# Patient Record
Sex: Female | Born: 1950
Health system: Southern US, Community
[De-identification: ages and names within clinical notes are randomized; demographics above are authoritative.]

## PROBLEM LIST (undated history)

## (undated) DIAGNOSIS — R7989 Other specified abnormal findings of blood chemistry: Secondary | ICD-10-CM

## (undated) DIAGNOSIS — E785 Hyperlipidemia, unspecified: Secondary | ICD-10-CM

## (undated) DIAGNOSIS — I10 Essential (primary) hypertension: Secondary | ICD-10-CM

## (undated) DIAGNOSIS — K219 Gastro-esophageal reflux disease without esophagitis: Secondary | ICD-10-CM

## (undated) HISTORY — DX: Essential (primary) hypertension: I10

## (undated) HISTORY — PX: NASAL SEPTUM SURGERY: SHX37

## (undated) HISTORY — DX: Gastro-esophageal reflux disease without esophagitis: K21.9

## (undated) HISTORY — DX: Hyperlipidemia, unspecified: E78.5

## (undated) HISTORY — DX: Other specified abnormal findings of blood chemistry: R79.89

## (undated) HISTORY — PX: ABDOMINAL HYSTERECTOMY: SHX81

---

## 2003-12-22 ENCOUNTER — Encounter: Payer: Self-pay | Admitting: Family Medicine

## 2003-12-22 ENCOUNTER — Other Ambulatory Visit: Admission: RE | Admit: 2003-12-22 | Discharge: 2003-12-22 | Payer: Self-pay | Admitting: Family Medicine

## 2003-12-22 LAB — CONVERTED CEMR LAB: Pap Smear: NORMAL

## 2004-07-25 ENCOUNTER — Ambulatory Visit: Payer: Self-pay | Admitting: Family Medicine

## 2005-04-11 ENCOUNTER — Ambulatory Visit: Payer: Self-pay | Admitting: Family Medicine

## 2005-05-03 ENCOUNTER — Ambulatory Visit: Payer: Self-pay | Admitting: Internal Medicine

## 2005-05-30 ENCOUNTER — Ambulatory Visit: Payer: Self-pay | Admitting: Gastroenterology

## 2005-07-07 ENCOUNTER — Ambulatory Visit: Payer: Self-pay | Admitting: Gastroenterology

## 2005-07-07 ENCOUNTER — Ambulatory Visit (HOSPITAL_COMMUNITY): Admission: RE | Admit: 2005-07-07 | Discharge: 2005-07-07 | Payer: Self-pay | Admitting: Gastroenterology

## 2006-11-14 ENCOUNTER — Ambulatory Visit: Payer: Self-pay | Admitting: Family Medicine

## 2006-11-14 LAB — CONVERTED CEMR LAB
Nitrite: NEGATIVE
Specific Gravity, Urine: 1.01

## 2006-12-19 ENCOUNTER — Telehealth (INDEPENDENT_AMBULATORY_CARE_PROVIDER_SITE_OTHER): Payer: Self-pay | Admitting: *Deleted

## 2007-04-01 ENCOUNTER — Telehealth (INDEPENDENT_AMBULATORY_CARE_PROVIDER_SITE_OTHER): Payer: Self-pay | Admitting: *Deleted

## 2007-04-03 ENCOUNTER — Telehealth (INDEPENDENT_AMBULATORY_CARE_PROVIDER_SITE_OTHER): Payer: Self-pay | Admitting: *Deleted

## 2007-12-25 ENCOUNTER — Encounter: Payer: Self-pay | Admitting: Family Medicine

## 2007-12-25 DIAGNOSIS — E785 Hyperlipidemia, unspecified: Secondary | ICD-10-CM | POA: Insufficient documentation

## 2007-12-25 DIAGNOSIS — K449 Diaphragmatic hernia without obstruction or gangrene: Secondary | ICD-10-CM | POA: Insufficient documentation

## 2007-12-25 DIAGNOSIS — I10 Essential (primary) hypertension: Secondary | ICD-10-CM | POA: Insufficient documentation

## 2007-12-25 DIAGNOSIS — K219 Gastro-esophageal reflux disease without esophagitis: Secondary | ICD-10-CM | POA: Insufficient documentation

## 2007-12-26 ENCOUNTER — Ambulatory Visit: Payer: Self-pay | Admitting: Family Medicine

## 2008-03-18 ENCOUNTER — Ambulatory Visit: Payer: Self-pay | Admitting: Family Medicine

## 2008-03-18 DIAGNOSIS — IMO0002 Reserved for concepts with insufficient information to code with codable children: Secondary | ICD-10-CM | POA: Insufficient documentation

## 2008-03-31 ENCOUNTER — Telehealth: Payer: Self-pay | Admitting: Family Medicine

## 2008-04-02 ENCOUNTER — Encounter: Payer: Self-pay | Admitting: Family Medicine

## 2008-08-24 ENCOUNTER — Encounter: Payer: Self-pay | Admitting: Family Medicine

## 2008-08-24 ENCOUNTER — Telehealth: Payer: Self-pay | Admitting: Family Medicine

## 2008-08-27 ENCOUNTER — Telehealth: Payer: Self-pay | Admitting: Family Medicine

## 2008-11-10 ENCOUNTER — Ambulatory Visit: Payer: Self-pay | Admitting: Family Medicine

## 2009-09-15 ENCOUNTER — Telehealth: Payer: Self-pay | Admitting: Family Medicine

## 2010-03-30 ENCOUNTER — Ambulatory Visit: Payer: Self-pay | Admitting: Family Medicine

## 2010-03-31 ENCOUNTER — Ambulatory Visit: Payer: Self-pay | Admitting: Family Medicine

## 2010-03-31 DIAGNOSIS — E0590 Thyrotoxicosis, unspecified without thyrotoxic crisis or storm: Secondary | ICD-10-CM | POA: Insufficient documentation

## 2010-03-31 LAB — CONVERTED CEMR LAB
Albumin: 4.4 g/dL (ref 3.5–5.2)
Basophils Relative: 0.7 % (ref 0.0–3.0)
Bilirubin, Direct: 0.1 mg/dL (ref 0.0–0.3)
Calcium: 9.9 mg/dL (ref 8.4–10.5)
Creatinine, Ser: 0.7 mg/dL (ref 0.4–1.2)
Direct LDL: 163.8 mg/dL
Eosinophils Absolute: 0.1 10*3/uL (ref 0.0–0.7)
Glucose, Bld: 87 mg/dL (ref 70–99)
HCT: 42.8 % (ref 36.0–46.0)
HDL: 59.3 mg/dL (ref >39.00)
Lymphs Abs: 1.9 10*3/uL (ref 0.7–4.0)
MCHC: 34.3 g/dL (ref 30.0–36.0)
MCV: 93.3 fL (ref 78.0–100.0)
Monocytes Absolute: 0.4 10*3/uL (ref 0.1–1.0)
Neutrophils Relative %: 60.9 % (ref 43.0–77.0)
Potassium: 4.2 meq/L (ref 3.5–5.1)
RBC: 4.59 M/uL (ref 3.87–5.11)
TSH: 0.28 microintl units/mL — ABNORMAL LOW (ref 0.35–5.50)
Total Protein: 6.3 g/dL (ref 6.0–8.3)
Triglycerides: 155 mg/dL — ABNORMAL HIGH (ref 0.0–149.0)

## 2010-04-04 LAB — CONVERTED CEMR LAB
T3 Uptake Ratio: 38.1 % — ABNORMAL HIGH (ref 22.5–37.0)
TSH: 0.28 microintl units/mL — ABNORMAL LOW (ref 0.35–5.50)

## 2010-04-21 ENCOUNTER — Ambulatory Visit: Payer: Self-pay | Admitting: Family Medicine

## 2010-04-21 ENCOUNTER — Encounter: Payer: Self-pay | Admitting: Family Medicine

## 2010-04-22 LAB — HM MAMMOGRAPHY: HM Mammogram: NORMAL

## 2010-04-25 ENCOUNTER — Encounter: Payer: Self-pay | Admitting: Family Medicine

## 2010-05-04 ENCOUNTER — Ambulatory Visit: Payer: Self-pay | Admitting: Family Medicine

## 2010-05-04 DIAGNOSIS — Z9189 Other specified personal risk factors, not elsewhere classified: Secondary | ICD-10-CM | POA: Insufficient documentation

## 2010-07-26 ENCOUNTER — Encounter (INDEPENDENT_AMBULATORY_CARE_PROVIDER_SITE_OTHER): Payer: Self-pay | Admitting: *Deleted

## 2010-07-26 ENCOUNTER — Ambulatory Visit
Admission: RE | Admit: 2010-07-26 | Discharge: 2010-07-26 | Payer: Self-pay | Source: Home / Self Care | Attending: Family Medicine | Admitting: Family Medicine

## 2010-08-16 NOTE — Assessment & Plan Note (Signed)
Summary: MED REFILL/DLO   Vital Signs:  Patient profile:   60 year old female Height:      66 inches Weight:      166.25 pounds BMI:     26.93 Temp:     98 degrees F oral Pulse rate:   76 / minute Pulse rhythm:   regular BP sitting:   142 / 98  (left arm) Cuff size:   regular  Vitals Entered By: Lewanda Rife LPN (09-Apr-2010 11:33 AM)  Serial Vital Signs/Assessments:  Time      Position  BP       Pulse  Resp  Temp     By                     15/98                          Judith Part MD  CC: med refill Comments Pt said had been rushing this AM and that is why BP up.   History of Present Illness: here for f/u of HTN and lipids   has been working a lot  doing fine overall   has not been here in a while  no labs on record  remote hx of abn tsh   bp up today 142/98-- thinks this is from rushing  feeling fine   has been eating a healthy diet  a little exercise - not much -- likes walking -- issue of getting the time    Td 05 does not get flu shot   does not see gyn  had hyst in the past  does not keep up with mammograms   wants [place] to schedule mammogram   Allergies: 1)  ! 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 Eyes:  Denies blurring and eye irritation. CV:  Denies chest pain or discomfort, lightheadness, palpitations, and shortness of breath with exertion. Resp:  Denies cough, shortness of breath, and wheezing. GI:  Denies abdominal pain, change in bowel habits, and indigestion. GU:  Denies hematuria and urinary frequency. MS:  Denies joint pain, joint redness, and joint swelling. Derm:  Denies lesion(s), poor wound healing, and rash. Neuro:  Denies numbness and tingling. Psych:  Denies anxiety and depression. Endo:  Denies cold intolerance, excessive thirst, excessive urination, and heat intolerance. Heme:  Denies abnormal bruising and bleeding.  Physical Exam  General:  mildly overweight but generally well appearing  Head:  normocephalic, atraumatic, and no abnormalities observed.   Eyes:  vision grossly intact, pupils equal, pupils round, and pupils reactive to light.  no conjunctival pallor, injection or icterus  Ears:  R ear normal and L ear normal.   Mouth:  pharynx pink and moist.   Neck:  supple with full  rom and no masses or thyromegally, no JVD or carotid bruit  Chest Wall:  No deformities, masses, or tenderness noted. Lungs:  Normal respiratory effort, chest expands symmetrically. Lungs are clear to auscultation, no crackles or wheezes. Heart:  Normal rate and regular rhythm. S1 and S2 normal without gallop, murmur, click, rub or other extra sounds. Abdomen:  Bowel sounds positive,abdomen soft and non-tender without masses, organomegaly or hernias noted. no renal bruits  Msk:  No deformity or scoliosis noted of thoracic or lumbar spine.  no acute joint changes, full rom of all joints  Pulses:  R and L carotid,radial,femoral,dorsalis pedis and posterior tibial pulses are full and equal bilaterally Extremities:  No clubbing, cyanosis, edema, or deformity noted with normal full range of motion of all joints.   Neurologic:  sensation intact to light  touch, gait normal, and DTRs symmetrical and normal.   Skin:  Intact without suspicious lesions or rashes lentigos diffusely  Cervical Nodes:  No lymphadenopathy noted Inguinal Nodes:  No significant adenopathy Psych:  normal affect, talkative and pleasant    Impression & Recommendations:  Problem # 1:  HYPERTENSION (ICD-401.9) Assessment Deteriorated  bp is up significantly  will inc her benicar hct to 40-25- disc poss side eff f/u 4-6 wk disc lifestyle change- work on exercise  Her updated medication list for this problem includes:    Benicar Hct 40-25 Mg Tabs (Olmesartan medoxomil-hctz) .Marland Kitchen... 1 by mouth once daily  Orders: Venipuncture ([address]) TLB-Lipid Panel (80061-LIPID) TLB-Renal Function Panel (80069-RENAL) TLB-CBC Platelet - w/Differential (85025-CBCD) TLB-Hepatic/Liver Function Pnl (80076-HEPATIC) TLB-TSH (Thyroid Stimulating Hormone) (60454-UJW) Prescription Created Electronically (J1914)  BP today: 142/98 Prior BP: 130/76 (11/10/2008)  Problem # 2:  HYPERLIPIDEMIA (ICD-272.4) Assessment: Unchanged labs today overall good diet rev low sat fat diet rev lab at f/u Orders: Venipuncture ([address]) TLB-Lipid Panel (80061-LIPID) TLB-Renal Function Panel (80069-RENAL) TLB-CBC Platelet - w/Differential (85025-CBCD) TLB-Hepatic/Liver Function Pnl (80076-HEPATIC) TLB-TSH (Thyroid Stimulating Hormone) (62130-QMV) Prescription Created Electronically [id number])  Problem # 3:  OTHER SCREENING MAMMOGRAM (ICD-V76.12) Assessment: Comment Only  pt wants mam although declines other health mt  this will be scheduled  Orders: Radiology Referral (Radiology)  Complete Medication List: 1)  Prevacid 30 Mg Cpdr (Lansoprazole) .Marland Kitchen.. 1 by mouth daily as  needed 2)  Vitamin C 500 Mg Tabs (Ascorbic acid) .Marland Kitchen.. 1 by mouth daily 3)  Multivitamins Tabs (Multiple vitamin) .Marland Kitchen.. 1 by mouth daily 4)  Benicar Hct 40-25 Mg Tabs (Olmesartan medoxomil-hctz) .Marland Kitchen.. 1 by mouth once  daily  Preventive Care Screening     pt declines all health mt except mammogram    Patient Instructions: 1)  increase your benicar hct to 40-25 2)  if any problems let me know  3)  watch salt in diet and start regular exercise program  4)  we will schedule mammogram at check out  5)  labs today 6)  follow up in 4-6 weeks to see how bp is doing on new dose  Prescriptions: BENICAR HCT 40-25 MG TABS (OLMESARTAN MEDOXOMIL-HCTZ) 1 by mouth once daily  #30 x 1   Entered and Authorized by:   Judith Part MD   Signed by:   Judith Part MD on 03/30/2010   Method used:   Electronically to        [facility] [phone]* (retail)       [address] [place], [place]  [address]  Ph: [phone] or [phone]       Fax: [phone]   RxID:   [id number]   Current Allergies (reviewed today): ! * ZESTORETIC   Preventive Care Screening     pt declines all health mt except mammogram

## 2010-08-16 NOTE — Assessment & Plan Note (Signed)
Summary: 4-6 WEEK FOLLOW UP FOR BP WITH NEW DOSE/RBH   Vital Signs:  Patient profile:   60 year old female Weight:      168.25 pounds BMI:     27.25 Temp:     98.1 degrees F oral Pulse rate:   84 / minute Pulse rhythm:   regular BP sitting:   138 / 88  (left arm) Cuff size:   regular  Vitals Entered By: Selena Batten Dance CMA Duncan Dull) (May 04, 2010 12:59 PM) CC: Follow up   History of Present Illness: here for f/u of HTN  wt is up 2 lb  bp better at 138/88 today had inc benicar to 40-25 and appears to be working is better if she eats a bit with it   was on trip with her sister and loud snorer -- severe is not tired when she wakes up , not overly sleepy   did labs and LDL high at 163- inst toeat better diet  does not eat meat at all  no fried foods, some egg yolks occasionally, butter, no ice cream , no high fat yogurt , occ shrimp , lots of cheese , some 2% mild   tsh low thyroid prof nl  will watch this  never had thyroid problem or goiter  not hyper or loosing weight   Allergies: 1)  ! 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 Eyes:  Denies blurring and eye irritation. ENT:  Denies difficulty swallowing. CV:  Denies chest pain or discomfort, palpitations, shortness of breath with  exertion, and swelling of feet. Resp:  Complains of excessive snoring; denies cough, hypersomnolence, morning headaches, pleuritic, shortness of breath, and wheezing. GI:  Denies abdominal pain, change in bowel habits, and nausea. GU:  Denies dysuria and urinary frequency. MS:  Denies muscle aches and cramps. Derm:  Denies itching, lesion(s), poor wound healing, and rash. Neuro:  Denies headaches, numbness, tingling, and tremors. Psych:  Denies anxiety and depression. Endo:  Denies cold intolerance, excessive thirst, excessive urination, and heat intolerance. Heme:  Denies abnormal bruising and bleeding.  Physical Exam  General:  mildly overwt and well appearing  Head:  normocephalic, atraumatic, and no abnormalities observed.   Eyes:  vision grossly intact, pupils equal, pupils round, and pupils reactive to light.  no conjunctival pallor, injection or icterus  Mouth:  pharynx pink and moist.   Neck:  supple with full rom and no masses or thyromegally, no JVD or carotid bruit  Chest Wall:  No deformities, masses, or tenderness noted. Lungs:  Normal respiratory effort, chest expands symmetrically. Lungs are clear to auscultation, no crackles or wheezes. Heart:  Normal rate and regular rhythm. S1 and S2 normal without gallop, murmur, click, rub or other extra sounds. Msk:  No deformity or scoliosis noted of thoracic or lumbar spine.  no acute joint changes, full rom of all joints  Pulses:  R and L carotid,radial,femoral,dorsalis pedis and  posterior tibial pulses are full and equal bilaterally Extremities:  No clubbing, cyanosis, edema, or deformity noted with normal full range of motion of all joints.   Neurologic:  sensation intact to light touch, gait normal, and DTRs symmetrical and normal.  no tremor  Skin:  Intact without suspicious lesions or rashes Cervical Nodes:  No lymphadenopathy noted Psych:  normal affect, talkative and pleasant    Impression & Recommendations:  Problem #  1:  HYPERTENSION (ICD-401.9) Assessment Improved  this is improved with inc dose of benicar  will continue to monitor - re check 3 mo f/u Her updated medication list for this problem includes:    Benicar Hct 40-25 Mg Tabs (Olmesartan medoxomil-hctz) .Marland Kitchen... 1 by mouth once daily  BP today: 138/88 Prior BP: 142/98 (03/30/2010)  Labs Reviewed: K+: 4.2 (03/30/2010) Creat: : 0.7 (03/30/2010)   Chol: 248 (03/30/2010)   HDL: 59.30 (03/30/2010)   TG: 155.0 (03/30/2010)  Problem # 2:  HYPERLIPIDEMIA (ICD-272.4) Assessment: Deteriorated  worse with LDL 160s  disc low sat fat diet in detail will cut egg yolks and high fat dairy re check in 3 mo and f/u  Problem # 3:  THYROID STIMULATING HORMONE, ABNORMAL (ICD-246.9) Assessment: Unchanged low tsh with nl profile and no symptoms  will re check 3 mo  consider endo appt if not imp  Problem # 4:  SNORING, HX OF (ICD-V15.89) Assessment: New loud snoring without witnessed apnea or somnolence  will watch for other s/s apnea and keep me updated will work on wt loss for the snoring  Complete Medication List: 1)  Prevacid 30 Mg Cpdr (Lansoprazole) .Marland Kitchen.. 1 by mouth daily as  needed 2)  Vitamin C 500 Mg Tabs (Ascorbic acid) .Marland Kitchen.. 1 by mouth daily 3)  Multivitamins Tabs (Multiple vitamin) .Marland Kitchen.. 1 by mouth daily 4)  Benicar Hct 40-25 Mg Tabs (Olmesartan medoxomil-hctz) .Marland Kitchen.. 1 by mouth once daily  Patient Instructions: 1)  you can raise your HDL (good cholesterol) by increasing exercise and eating omega 3 fatty acid supplement like fish oil or flax seed oil over the counter 2)  you can lower LDL (bad cholesterol) by limiting saturated fats in diet like red meat, fried foods, egg yolks, fatty breakfast meats, high fat dairy products and shellfish  3)  here is px for blood pressure med for you to mail to [facility]  4)  we will watch thyroid tests  5)  schedule fasting labs in 3 months lipid/ast/alt/ tsh/ free T4 272, abn tsh, and then follow up    Prescriptions: BENICAR HCT 40-25 MG TABS (OLMESARTAN MEDOXOMIL-HCTZ) 1 by mouth once daily  #90 x 3   Entered and Authorized by:   Judith Part MD   Signed by:   Judith Part MD on 05/04/2010   Method used:   Print then Give to Patient   RxID:   [id number]    Orders Added: 1)  Est. Patient Level IV [56213]    Current Allergies (reviewed today): ! * ZESTORETIC

## 2010-08-16 NOTE — Progress Notes (Signed)
Summary: [facility]ICAR/ [facility]  Phone Note Refill Request Message from:  [facility] on September 15, 2009 2:40 PM  Refills Requested: Medication #1:  BENICAR HCT 20-12.5 MG  TABS 1 by mouth once daily fax request asking for refill, pt has not been in for physical since 2009, was last seen for tick bite. Ok to refill? this can be sent electronically to [facility].   Method Requested: Electronic Initial call taken by: Mervin Hack CMA Duncan Dull),  September 15, 2009 2:41 PM  Follow-up for Phone Call        please schedule her for f/u with me this spring for HTN visit  can refil -- will put on EMR Follow-up by: Judith Part MD,  September 15, 2009 5:25 PM  Additional Follow-up for Phone Call Additional follow up Details #1::        Patient Advised.  Additional Follow-up by: Delilah Shan CMA (AAMA),  September 15, 2009 5:46 PM    New/Updated Medications: BENICAR HCT 20-12.5 MG  TABS (OLMESARTAN MEDOXOMIL-HCTZ) 1 by mouth once daily Prescriptions: BENICAR HCT 20-12.5 MG  TABS (OLMESARTAN MEDOXOMIL-HCTZ) 1 by mouth once daily  #90 x 1   Entered by:   Delilah Shan CMA (AAMA)   Authorized by:   Judith Part MD   Signed by:   Delilah Shan CMA (AAMA) on 09/15/2009   Method used:   Electronically to        [facility]* (mail-order)             ,          Ph: [phone]       Fax: [phone]   RxID:   [id number]

## 2010-08-16 NOTE — Letter (Signed)
Summary: Results Follow up Letter  [place] [facility]  [address] [place], [place] [address]   Phone: [phone]  Fax: [phone]    04/25/2010 MRN: [id number]  Caroline Francis [address] [place], [place]  [address]  Dear [name],  The following are the results of your recent test(s):  Test         Result    Pap Smear:        Normal _____  Not Normal _____ Comments: ______________________________________________________ Cholesterol: LDL(Bad cholesterol):         Your goal is less than:         HDL (Good cholesterol):       Your goal is more than: Comments:  ______________________________________________________ Mammogram:        Normal __X___  Not Normal _____ Comments: Please repeat mammogram in one year.  ___________________________________________________________________ Hemoccult:        Normal _____  Not normal _______ Comments:    _____________________________________________________________________ Other Tests:    We routinely do not discuss normal results over the telephone.  If you desire a copy of the results, or you have any questions about this information we can discuss them at your next office visit.   Sincerely,      Roxy Manns, MD

## 2010-08-18 NOTE — Assessment & Plan Note (Signed)
Summary: SINUS SYMPTOMS   Vital Signs:  Patient profile:   60 year old female Weight:      168.50 pounds Temp:     98.5 degrees F oral Pulse rate:   88 / minute Pulse rhythm:   regular BP sitting:   130 / 82  (left arm) Cuff size:   regular  Vitals Entered By: Selena Batten Dance CMA Duncan Dull) (July 26, 2010 3:09 PM) CC: Sinus symptoms   History of Present Illness: CC: sinus sxs?  3d h/o sinus symptoms with cough.  + pressure and congestion in head, popping in ears.  Taking mucinex D as well as alkaseltzer cold plus.  + tooth and gum pain.  no change with bending head forward.  + sinus pressure headache worse L frontal.  Clear discharge out of nose.  Dry cough.  Tried neti pot last night, didnt' help.    No fevers/chills, n/v/d, abd pain, myalgias or arthralgias, rashes  + sick contacts at work.  No smokers at home.    No h/o asthma or allergies or COPD.  + h/o recurrent sinus infections.  + h/o HTN.    Current Medications (verified): 1)  Prevacid 30 Mg Cpdr (Lansoprazole) .Marland Kitchen.. 1 By Mouth Daily As  Needed 2)  Vitamin C 500 Mg Tabs (Ascorbic Acid) .Marland Kitchen.. 1 By Mouth Daily 3)  Benicar Hct 40-25 Mg Tabs (Olmesartan Medoxomil-Hctz) .Marland Kitchen.. 1 By Mouth Once Daily  Allergies: 1)  ! 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  minimal frontal sinus tenderness Eyes:  vision grossly intact, pupils equal, pupils round, and pupils reactive to light.  no conjunctival pallor, injection or icterus  Ears:  R ear normal and L ear normal.   Nose:  nares clear bilaterally Mouth:  MMM no pharyngeal  erythema Neck:  supple with full rom and no masses or thyromegally, no JVD or carotid bruit  Lungs:  Normal respiratory effort, chest expands symmetrically. Lungs are clear to auscultation, no crackles or wheezes. Heart:  Normal rate and regular rhythm. S1 and S2 normal without gallop, murmur, click, rub or other extra sounds. Pulses:  2+ rad pulses Extremities:  No clubbing, cyanosis, edema, or deformity noted with normal full range of motion of all joints.     Impression & Recommendations:  Problem # 1:  VIRAL URI (ICD-465.9) Instructed on symptomatic treatment. Call if symptoms persist or worsen.  could be early sinusitis.  Her updated medication list for this problem includes:    Cheratussin Ac 100-10 Mg/34ml Syrp (Guaifenesin-codeine) ..... One teaspoon q6 hours as needed cough, sedation precautions  Complete Medication List: 1)  Prevacid 30 Mg Cpdr (Lansoprazole) .Marland Kitchen.. 1 by mouth daily as  needed 2)  Vitamin C 500 Mg Tabs (Ascorbic acid) .Marland Kitchen.. 1 by mouth daily 3)  Benicar Hct 40-25 Mg Tabs (Olmesartan medoxomil-hctz) .Marland Kitchen.. 1 by mouth once daily 4)  Cheratussin Ac 100-10 Mg/51ml Syrp (Guaifenesin-codeine) .... One teaspoon q6 hours as needed cough, sedation precautions   Patient Instructions: 1)  Sounds like you have a viral upper respiratory infection. 2)  Antibiotics are not needed for this.  Viral infections usually  take 7-10 days to resolve.  The cough can last 4 weeks to go away. 3)  continue pushing fluids and plenty of rest.  nasal saline and simple mucinex 4)  Use medication as prescribed: cheratussin for cough at night. 5)  Please return if you are not improving as expected, or if you have high fevers (>101.5) or difficulty swallowing. 6)  Call clinic with questions.  Pleasure to see you today!  Prescriptions: CHERATUSSIN AC 100-10 MG/5ML SYRP (GUAIFENESIN-CODEINE) one teaspoon q6 hours as needed cough, sedation precautions  #100cc x 0   Entered and Authorized by:   Eustaquio Boyden  MD   Signed by:   Eustaquio Boyden  MD on 07/26/2010   Method used:   Print then Give to Patient   RxID:   [id number]    Orders Added: 1)  Est. Patient Level III [14782]    Current Allergies (reviewed today): ! * ZESTORETIC

## 2010-08-18 NOTE — Letter (Signed)
Summary: Out of Work  [facility] [employer] [facility]  [address] [place], [place] [address]   Phone: [phone]  Fax: [phone]    July 26, 2010   Employee:  GEORGETTA CRAFTON    To Whom It May Concern:   For Medical reasons, please excuse the above named employee from work for the following dates:  Start:  July 26, 2010   End:   July 28, 2010-(patient can return to work on this date)  If you need additional information, please feel free to contact our office.         Sincerely,       Selena Batten Dance CMA (AAMA)

## 2011-05-16 ENCOUNTER — Other Ambulatory Visit: Payer: Self-pay | Admitting: *Deleted

## 2011-05-16 MED ORDER — OLMESARTAN MEDOXOMIL-HCTZ 40-25 MG PO TABS: 1.0000 | ORAL_TABLET | Freq: Every day | ORAL | Status: DC

## 2011-08-25 ENCOUNTER — Other Ambulatory Visit: Payer: Self-pay | Admitting: *Deleted

## 2011-08-25 MED ORDER — OLMESARTAN MEDOXOMIL-HCTZ 40-25 MG PO TABS: 1.0000 | ORAL_TABLET | Freq: Every day | ORAL | Status: DC

## 2011-08-25 NOTE — Telephone Encounter (Signed)
Schedule a f/u please Short refil done electronically

## 2011-08-25 NOTE — Telephone Encounter (Signed)
Jamie please schedule appt. Thank you. 

## 2011-08-28 NOTE — Telephone Encounter (Signed)
Called and spoke with pt. Set up a f/u

## 2011-09-04 ENCOUNTER — Ambulatory Visit (INDEPENDENT_AMBULATORY_CARE_PROVIDER_SITE_OTHER): Payer: BC Managed Care – PPO | Admitting: Family Medicine

## 2011-09-04 ENCOUNTER — Encounter: Payer: Self-pay | Admitting: Family Medicine

## 2011-09-04 VITALS — BP 118/84 | HR 80 | Temp 98.3°F | Ht 66.0 in | Wt 167.8 lb

## 2011-09-04 DIAGNOSIS — I10 Essential (primary) hypertension: Secondary | ICD-10-CM

## 2011-09-04 DIAGNOSIS — E785 Hyperlipidemia, unspecified: Secondary | ICD-10-CM

## 2011-09-04 DIAGNOSIS — Z20828 Contact with and (suspected) exposure to other viral communicable diseases: Secondary | ICD-10-CM

## 2011-09-04 DIAGNOSIS — K219 Gastro-esophageal reflux disease without esophagitis: Secondary | ICD-10-CM

## 2011-09-04 DIAGNOSIS — E079 Disorder of thyroid, unspecified: Secondary | ICD-10-CM

## 2011-09-04 MED ORDER — OSELTAMIVIR PHOSPHATE 75 MG PO CAPS: 75.0000 mg | ORAL_CAPSULE | Freq: Every day | ORAL | Status: AC

## 2011-09-04 MED ORDER — OLMESARTAN MEDOXOMIL-HCTZ 40-25 MG PO TABS: 1.0000 | ORAL_TABLET | Freq: Every day | ORAL | Status: DC

## 2011-09-04 NOTE — Assessment & Plan Note (Signed)
Pt changed to otc prevacid 15 only as needed Doing well overall Med list changed

## 2011-09-04 NOTE — Assessment & Plan Note (Signed)
Heavy recent exp to the flu  Given tamiflu prophylaxis Disc symptom control if she does get sick

## 2011-09-04 NOTE — Patient Instructions (Signed)
No change in medicine Take 10 days of tamiflu daily for prevention  Also avoid flu infected people / wear a mask  Schedule PE in late spring with labs prior please

## 2011-09-04 NOTE — Progress Notes (Signed)
Subjective:    Patient ID: Caroline Francis, female    DOB: 09/03/1950, 61 y.o.   MRN: [id number]  HPI Here for f/u for chronic medical problems incl lipid/ HTN / gerd/ abn tsh Is feeling good overall  No new medical problems   Just got back from [place] to multiple people with flu - and also daughter has flu  Needs tamiflu px for prevention  Wt is down 1 lb with bmi of 27  bp is 118/84     Today No cp or palpitations or headaches or edema  No side effects to medicines    Lipids- diet controlled Lab Results  Component Value Date   CHOL 248* 03/30/2010   HDL 59.30 03/30/2010   LDLDIRECT 163.8 03/30/2010   TRIG 155.0* 03/30/2010   CHOLHDL 4 03/30/2010   diet-about the same / no meat at all except for fish -- no shellfish  Exercise- too cold to walk outside - needs indoor exercise   Lab Results  Component Value Date   TSH 0.28* 03/31/2010   low last check  Feels fine No goiter or knot in neck   Health mt issues  Does not see gyn/ had a hyst  Does get mammo- nl in October   Patient Active Problem List  Diagnoses  . THYROID STIMULATING HORMONE, ABNORMAL  . HYPERLIPIDEMIA  . HYPERTENSION  . GERD  . HIATAL HERNIA  . SHOULDER STRAIN  . SNORING, HX OF  . Exposure to the flu   Past Medical History  Diagnosis Date  . GERD (gastroesophageal reflux disease)   . Hyperlipidemia   . Hypertension   . Low TSH level    Past Surgical History  Procedure Date  . Abdominal hysterectomy     partial /bleeding  . Nasal septum surgery    History  Substance Use Topics  . Smoking status: Never Smoker   . Smokeless tobacco: Not on file  . Alcohol Use: Yes     occasional glass of wine   No family history on file. Allergies  Allergen Reactions  . Lisinopril-Hydrochlorothiazide     REACTION: side effect 03/30/10 Pt said not allergic and no adverse reactions to any meds.   No current outpatient prescriptions on file prior to visit.     Review of Systems Review of Systems    Constitutional: Negative for fever, appetite change, fatigue and unexpected weight change.  Eyes: Negative for pain and visual disturbance.  Respiratory: Negative for cough and shortness of breath.   Cardiovascular: Negative for cp or palpitations    Gastrointestinal: Negative for nausea, diarrhea and constipation.  Genitourinary: Negative for urgency and frequency.  Skin: Negative for pallor or rash   Neurological: Negative for weakness, light-headedness, numbness and headaches.  Hematological: Negative for adenopathy. Does not bruise/bleed easily.  Psychiatric/Behavioral: Negative for dysphoric mood. The patient is not nervous/anxious.          Objective:   Physical Exam  Constitutional: She appears well-developed and well-nourished. No distress.  HENT:  Head: Normocephalic and atraumatic.  Mouth/Throat: Oropharynx is clear and moist.  Eyes: Conjunctivae and EOM are normal. Pupils are equal, round, and reactive to light. No scleral icterus.  Neck: Normal range of motion. Neck supple. No JVD present. Carotid bruit is not present. No thyromegaly present.  Cardiovascular: Normal rate, regular rhythm, normal heart sounds and intact distal pulses.  Exam reveals no gallop.   Pulmonary/Chest: Effort normal and breath sounds normal. No respiratory distress. She has no wheezes. She  exhibits no tenderness.  Abdominal: Soft. Bowel sounds are normal. She exhibits no distension, no abdominal bruit and no mass. There is no tenderness.  Musculoskeletal: She exhibits no edema and no tenderness.  Lymphadenopathy:    She has no cervical adenopathy.  Neurological: She is alert. She has normal reflexes. No cranial nerve deficit. She exhibits normal muscle tone. Coordination normal.  Skin: Skin is warm and dry. No rash noted. No erythema. No pallor.  Psychiatric: She has a normal mood and affect.          Assessment & Plan:

## 2011-09-04 NOTE — Assessment & Plan Note (Signed)
No symptoms Will re check this at pre physical labs

## 2011-09-04 NOTE — Assessment & Plan Note (Signed)
Good control refil benicar hct bp in fair control at this time  No changes needed  Disc lifstyle change with low sodium diet and exercise   sched labs and PE for spring

## 2011-09-04 NOTE — Assessment & Plan Note (Signed)
Rev low sat fat diet sched lab and PE for spring- will disc further then

## 2011-10-24 ENCOUNTER — Telehealth: Payer: Self-pay | Admitting: Family Medicine

## 2011-10-24 ENCOUNTER — Encounter: Payer: Self-pay | Admitting: Internal Medicine

## 2011-10-24 ENCOUNTER — Ambulatory Visit (INDEPENDENT_AMBULATORY_CARE_PROVIDER_SITE_OTHER): Payer: BC Managed Care – PPO | Admitting: Internal Medicine

## 2011-10-24 VITALS — BP 110/70 | HR 71 | Temp 98.1°F | Wt 163.0 lb

## 2011-10-24 DIAGNOSIS — J019 Acute sinusitis, unspecified: Secondary | ICD-10-CM

## 2011-10-24 MED ORDER — AMOXICILLIN 500 MG PO TABS: 1000.0000 mg | ORAL_TABLET | Freq: Two times a day (BID) | ORAL | Status: AC

## 2011-10-24 MED ORDER — FLUTICASONE PROPIONATE 50 MCG/ACT NA SUSP: 2.0000 | Freq: Two times a day (BID) | NASAL | Status: DC

## 2011-10-24 NOTE — Telephone Encounter (Signed)
Triage Record Num: [id number] Operator: Lesli Albee Patient Name: Caroline Francis Call Date & Time: 10/24/2011 9:20:26AM Patient Phone: [phone] PCP: Patient Gender: Female PCP Fax : Patient DOB: April 23, 1951 Practice Name: [facility] Reason for Call: Caller: Nazaria/Mother; PCP: Roxy Manns A.; CB#: [phone]; ; ; Call regarding Cough/Congestion; Pt is calling for next available appt for a possible sinus infection. pt scheduled with Dr. Alphonsus Sias at 10:15. No triage wanted/just appt. Protocol(s) Used: Information Only Call; No Symptom Triage (Adult) Recommended Outcome per Protocol: Provide Information or Advice Only Reason for Outcome: Health information question; no triage required. Information provided from approved references or clinical experience. Care Advice: ~

## 2011-10-24 NOTE — Telephone Encounter (Signed)
Error

## 2011-10-24 NOTE — Progress Notes (Signed)
  Subjective:    Patient ID: Caroline Francis, female    DOB: April 02, 1951, 61 y.o.   MRN: [id number]  HPI Has been having sinus pressure and headaches--left frontal Trouble breathing due to nasal congestion Lots of sneezing and nasal drainage Alka seltzer cold plus helps the drainage but increases stuffiness  Started about 3 days ago No fever Some cough---cough drops help Non productive  No sore throat  No ear pain  Current Outpatient Prescriptions on File Prior to Visit  Medication Sig Dispense Refill  . lansoprazole (PREVACID) 15 MG capsule Take 15 mg by mouth daily as needed.      Marland Kitchen olmesartan-hydrochlorothiazide (BENICAR HCT) 40-25 MG per tablet Take 1 tablet by mouth daily.  90 tablet  1  . vitamin C (ASCORBIC ACID) 500 MG tablet Take 500 mg by mouth daily.        Allergies  Allergen Reactions  . Lisinopril-Hydrochlorothiazide     REACTION: side effect 03/30/10 Pt said not allergic and no adverse reactions to any meds.    Past Medical History  Diagnosis Date  . GERD (gastroesophageal reflux disease)   . Hyperlipidemia   . Hypertension   . Low TSH level     Past Surgical History  Procedure Date  . Abdominal hysterectomy     partial /bleeding  . Nasal septum surgery     No family history on file.  History   Social History  . Marital Status: Divorced    Spouse Name: N/A    Number of Children: N/A  . Years of Education: N/A   Occupational History  . Not on file.   Social History Main Topics  . Smoking status: Never Smoker   . Smokeless tobacco: Never Used  . Alcohol Use: Yes     occasional glass of wine  . Drug Use:   . Sexually Active:    Other Topics Concern  . Not on file   Social History Narrative  . No narrative on file   Review of Systems No spring allergies No vomiting or diarrhea Appetite is okay     Objective:   Physical Exam  Constitutional: She appears well-developed and well-nourished. No distress.  HENT:  Mouth/Throat:  Oropharynx is clear and moist. No oropharyngeal exudate.       No clear sinus tenderness TMs fine Marked nasal inflammation---more on right  Neck: Normal range of motion.  Pulmonary/Chest: Effort normal and breath sounds normal. No respiratory distress. She has no wheezes. She has no rales.  Lymphadenopathy:    She has no cervical adenopathy.          Assessment & Plan:

## 2011-10-24 NOTE — Assessment & Plan Note (Signed)
Seems to be viral Will try Neti pot and brief course of fluticasone Start amoxil if worsening or not better by weekend or early next week

## 2011-10-24 NOTE — Patient Instructions (Signed)
Please continue with pain relievers and try a Neti pot to see if that helps reduce the swelling in your nose. Please use the fluticasone spray twice a day---you can stop this a week after your symptoms have resolved Please start the antibiotic if you worsen or if you aren't improving by the weekend or early next week

## 2012-02-08 ENCOUNTER — Other Ambulatory Visit: Payer: Self-pay

## 2012-02-08 NOTE — Telephone Encounter (Signed)
Pt left v/m requesting refill for generic Benicar- HCTZ 40/25 mg. Benicar HCTZ does not come in generic. Pt also supposed to schedule CPX with labs prior in late spring per 09/04/11 discharge instructions. No appt scheduled. Left v/m for pt to call back.

## 2012-02-08 NOTE — Telephone Encounter (Signed)
Left v/m for pt to call back; Benicar does not come in generic; does pt want continue same BP med or request different BP med that is generic.

## 2012-02-08 NOTE — Telephone Encounter (Signed)
Spoke with pt and scheduled a CPE for late September.

## 2012-02-21 MED ORDER — OLMESARTAN MEDOXOMIL-HCTZ 40-25 MG PO TABS: 1.0000 | ORAL_TABLET | Freq: Every day | ORAL | Status: DC

## 2012-02-21 NOTE — Telephone Encounter (Signed)
Pt wants to continue Benicar HCTZ 40-25. # 90 x [phone] sent to [facility]; pt notified done while on phone.

## 2012-02-21 NOTE — Telephone Encounter (Signed)
Pt left v/m this AM cking on status of Benicar refill. Left v/m for pt to call back. Verified with Brett Canales at [facility] hctz 40/25 does not come in generic.

## 2012-04-02 ENCOUNTER — Telehealth: Payer: Self-pay | Admitting: Family Medicine

## 2012-04-02 DIAGNOSIS — Z0000 Encounter for general adult medical examination without abnormal findings: Secondary | ICD-10-CM | POA: Insufficient documentation

## 2012-04-02 DIAGNOSIS — E785 Hyperlipidemia, unspecified: Secondary | ICD-10-CM

## 2012-04-02 NOTE — Telephone Encounter (Signed)
Message copied by Judy Pimple on Tue Apr 02, 2012  8:50 PM ------      Message from: Baldomero Lamy      Created: Wed Mar 27, 2012 11:06 AM      Regarding: Cpx labs Wed 9/18       Please order  future cpx labs for pt's upcomming lab appt.      Thanks      Rodney Booze

## 2012-04-03 ENCOUNTER — Other Ambulatory Visit (INDEPENDENT_AMBULATORY_CARE_PROVIDER_SITE_OTHER): Payer: BC Managed Care – PPO

## 2012-04-03 DIAGNOSIS — Z0000 Encounter for general adult medical examination without abnormal findings: Secondary | ICD-10-CM

## 2012-04-03 DIAGNOSIS — E785 Hyperlipidemia, unspecified: Secondary | ICD-10-CM

## 2012-04-03 LAB — TSH: TSH: 0.2 u[IU]/mL — ABNORMAL LOW (ref 0.35–5.50)

## 2012-04-03 LAB — COMPREHENSIVE METABOLIC PANEL
ALT: 20 U/L (ref 0–35)
AST: 19 U/L (ref 0–37)
Albumin: 4.4 g/dL (ref 3.5–5.2)
Alkaline Phosphatase: 61 U/L (ref 39–117)
Calcium: 9.6 mg/dL (ref 8.4–10.5)
Chloride: 103 mEq/L (ref 96–112)
Potassium: 3.8 mEq/L (ref 3.5–5.1)
Sodium: 140 mEq/L (ref 135–145)

## 2012-04-03 LAB — CBC WITH DIFFERENTIAL/PLATELET
Basophils Absolute: 0 10*3/uL (ref 0.0–0.1)
Basophils Relative: 0.7 % (ref 0.0–3.0)
Eosinophils Absolute: 0.1 10*3/uL (ref 0.0–0.7)
Lymphocytes Relative: 29.6 % (ref 12.0–46.0)
MCHC: 33.4 g/dL (ref 30.0–36.0)
MCV: 93.2 fl (ref 78.0–100.0)
Monocytes Absolute: 0.4 10*3/uL (ref 0.1–1.0)
Neutro Abs: 3.6 10*3/uL (ref 1.4–7.7)
Neutrophils Relative %: 60.4 % (ref 43.0–77.0)
RBC: 4.66 Mil/uL (ref 3.87–5.11)
RDW: 12.6 % (ref 11.5–14.6)

## 2012-04-03 LAB — LDL CHOLESTEROL, DIRECT: Direct LDL: 144.7 mg/dL

## 2012-04-03 LAB — LIPID PANEL
HDL: 68 mg/dL (ref >39.00)
Triglycerides: 132 mg/dL (ref 0.0–149.0)

## 2012-04-09 ENCOUNTER — Encounter: Payer: Self-pay | Admitting: Family Medicine

## 2012-04-09 ENCOUNTER — Ambulatory Visit (INDEPENDENT_AMBULATORY_CARE_PROVIDER_SITE_OTHER): Payer: BC Managed Care – PPO | Admitting: Family Medicine

## 2012-04-09 VITALS — BP 118/82 | HR 78 | Temp 98.5°F | Ht 66.5 in | Wt 164.8 lb

## 2012-04-09 DIAGNOSIS — E785 Hyperlipidemia, unspecified: Secondary | ICD-10-CM

## 2012-04-09 DIAGNOSIS — Z1231 Encounter for screening mammogram for malignant neoplasm of breast: Secondary | ICD-10-CM

## 2012-04-09 DIAGNOSIS — K644 Residual hemorrhoidal skin tags: Secondary | ICD-10-CM | POA: Insufficient documentation

## 2012-04-09 DIAGNOSIS — E079 Disorder of thyroid, unspecified: Secondary | ICD-10-CM

## 2012-04-09 DIAGNOSIS — Z0000 Encounter for general adult medical examination without abnormal findings: Secondary | ICD-10-CM

## 2012-04-09 DIAGNOSIS — I10 Essential (primary) hypertension: Secondary | ICD-10-CM

## 2012-04-09 MED ORDER — OLMESARTAN MEDOXOMIL-HCTZ 40-25 MG PO TABS: 1.0000 | ORAL_TABLET | Freq: Every day | ORAL | Status: DC

## 2012-04-09 MED ORDER — HYDROCORTISONE 2.5 % RE CREA: TOPICAL_CREAM | Freq: Every evening | RECTAL | Status: DC | PRN

## 2012-04-09 NOTE — Progress Notes (Signed)
Subjective:    Patient ID: Caroline Francis, female    DOB: 1950/12/15, 61 y.o.   MRN: [id number]  HPI Here for health maintenance exam and to review chronic medical problems    Is having some hemorrhoid problems -- itching primarily, not bleeding, no pain  Tries not to strain  Has tried prep H- limited help Not a bike rider   Wt is stable at bmi of 26 No regular exercise at this point  Does sit all day   Zoster - not interested in vaccine  Not interested in flu vaccine   Had hysterectomy in the past - for bleeding  No abn paps in past   mammo 10/11 Due for that  Goes to [facility] [place] No lumps on self exam   colonosc- less than 10 years ago - unsure when - thinks with [facility]  Thinks it was negative   bp is stable today  No cp or palpitations or headaches or edema  No side effects to medicines  BP Readings from Last 3 Encounters:  04/09/12 118/82  10/24/11 110/70  09/04/11 118/84       Chemistry      Component Value Date/Time   NA 140 04/03/2012 0817   K 3.8 04/03/2012 0817   CL 103 04/03/2012 0817   CO2 29 04/03/2012 0817   BUN 8 04/03/2012 0817   CREATININE 0.7 04/03/2012 0817      Component Value Date/Time   CALCIUM 9.6 04/03/2012 0817   ALKPHOS 61 04/03/2012 0817   AST 19 04/03/2012 0817   ALT 20 04/03/2012 0817   BILITOT 0.9 04/03/2012 0817       Lab Results  Component Value Date   CHOL 235* 04/03/2012   CHOL 248* 03/30/2010   Lab Results  Component Value Date   HDL 68.00 04/03/2012   HDL 40.98 03/30/2010   No results found for this basename: [facility]   Lab Results  Component Value Date   TRIG 132.0 04/03/2012   TRIG 155.0* 03/30/2010   Lab Results  Component Value Date   CHOLHDL 3 04/03/2012   CHOLHDL 4 03/30/2010   Lab Results  Component Value Date   LDLDIRECT 144.7 04/03/2012   LDLDIRECT 163.8 03/30/2010   is high - but improved from last check  Does not eat meat  Does eat some fried foods  Diet is not perfect Unsure of fam hx of chol No CAD  in family   Lab Results  Component Value Date   TSH 0.20* 04/03/2012   no symptoms at all   Patient Active Problem List  Diagnosis  . THYROID STIMULATING HORMONE, ABNORMAL  . HYPERLIPIDEMIA  . HYPERTENSION  . GERD  . HIATAL HERNIA  . SHOULDER STRAIN  . SNORING, HX OF  . Acute sinusitis, unspecified  . Routine general medical examination at a health care facility  . External hemorrhoids  . Other screening mammogram   Past Medical History  Diagnosis Date  . GERD (gastroesophageal reflux disease)   . Hyperlipidemia   . Hypertension   . Low TSH level    Past Surgical History  Procedure Date  . Abdominal hysterectomy     partial /bleeding  . Nasal septum surgery    History  Substance Use Topics  . Smoking status: Never Smoker   . Smokeless tobacco: Never Used  . Alcohol Use: Yes     occasional glass of wine   No family history on file. Allergies  Allergen Reactions  . Lisinopril-Hydrochlorothiazide Cough  REACTION: side effect 03/30/10 Pt said not allergic and no adverse reactions to any meds.   Current Outpatient Prescriptions on File Prior to Visit  Medication Sig Dispense Refill  . fluticasone (FLONASE) 50 MCG/ACT nasal spray Place 2 sprays into the nose 2 (two) times daily. In each nostril  16 g  1  . lansoprazole (PREVACID) 15 MG capsule Take 15 mg by mouth daily as needed.      Marland Kitchen olmesartan-hydrochlorothiazide (BENICAR HCT) 40-25 MG per tablet Take 1 tablet by mouth daily.  90 tablet  3  . vitamin C (ASCORBIC ACID) 500 MG tablet Take 500 mg by mouth daily.         Review of Systems Review of Systems  Constitutional: Negative for fever, appetite change, fatigue and unexpected weight change.  Eyes: Negative for pain and visual disturbance.  Respiratory: Negative for cough and shortness of breath.   Cardiovascular: Negative for cp or palpitations    Gastrointestinal: Negative for nausea, diarrhea and constipation. pos for hemorrhoids  Genitourinary:  Negative for urgency and frequency.  Skin: Negative for pallor or rash   Neurological: Negative for weakness, light-headedness, numbness and headaches.  Hematological: Negative for adenopathy. Does not bruise/bleed easily.  Psychiatric/Behavioral: Negative for dysphoric mood. The patient is not nervous/anxious.         Objective:   Physical Exam  Constitutional: She appears well-developed and well-nourished. No distress.  HENT:  Head: Normocephalic and atraumatic.  Right Ear: External ear normal.  Left Ear: External ear normal.  Nose: Nose normal.  Mouth/Throat: Oropharynx is clear and moist.  Eyes: Conjunctivae normal and EOM are normal. Pupils are equal, round, and reactive to light. No scleral icterus.  Neck: Normal range of motion. Neck supple. No JVD present. No thyromegaly present.       Thyroid is prominent  Cardiovascular: Normal rate, regular rhythm, normal heart sounds and intact distal pulses.  Exam reveals no gallop.   Pulmonary/Chest: Effort normal and breath sounds normal. No respiratory distress. She has no wheezes.  Abdominal: Soft. Bowel sounds are normal. She exhibits no distension and no mass. There is no tenderness.  Genitourinary: No breast swelling, tenderness, discharge or bleeding.       Breast exam: No mass, nodules, thickening, tenderness, bulging, retraction, inflamation, nipple discharge or skin changes noted.  No axillary or clavicular LA.  Chaperoned exam.    Musculoskeletal: She exhibits no edema and no tenderness.  Lymphadenopathy:    She has no cervical adenopathy.  Neurological: She is alert. She has normal reflexes. No cranial nerve deficit. She exhibits normal muscle tone. Coordination normal.  Skin: Skin is warm and dry. No rash noted. No erythema. No pallor.  Psychiatric: She has a normal mood and affect.          Assessment & Plan:

## 2012-04-09 NOTE — Patient Instructions (Addendum)
Get TUKS pads otc for hemorroids and try the anusol hc cream Try to avoid straining or constipation  Stop up front for mammogram referral and also referral to endocrine doctor for thyroid issue Please send for last colonoscopy report  Avoid red meat/ fried foods/ egg yolks/ fatty breakfast meats/ butter, cheese and high fat dairy/ and shellfish   Schedule fasting lab for cholesterol in 6 months please

## 2012-04-10 ENCOUNTER — Encounter: Payer: BC Managed Care – PPO | Admitting: Family Medicine

## 2012-04-18 ENCOUNTER — Ambulatory Visit: Payer: Self-pay | Admitting: Family Medicine

## 2012-04-19 ENCOUNTER — Encounter: Payer: Self-pay | Admitting: Family Medicine

## 2012-04-22 ENCOUNTER — Encounter: Payer: Self-pay | Admitting: *Deleted

## 2012-05-01 ENCOUNTER — Ambulatory Visit (INDEPENDENT_AMBULATORY_CARE_PROVIDER_SITE_OTHER): Payer: BC Managed Care – PPO | Admitting: *Deleted

## 2012-05-01 DIAGNOSIS — Z23 Encounter for immunization: Secondary | ICD-10-CM

## 2012-05-01 DIAGNOSIS — Z2911 Encounter for prophylactic immunotherapy for respiratory syncytial virus (RSV): Secondary | ICD-10-CM

## 2012-05-27 ENCOUNTER — Ambulatory Visit: Payer: Self-pay

## 2012-07-24 ENCOUNTER — Ambulatory Visit: Payer: Self-pay

## 2012-08-15 ENCOUNTER — Ambulatory Visit: Payer: Self-pay | Admitting: Unknown Physician Specialty

## 2012-09-05 ENCOUNTER — Ambulatory Visit: Payer: Self-pay | Admitting: Unknown Physician Specialty

## 2012-09-25 ENCOUNTER — Encounter: Payer: Self-pay | Admitting: Family Medicine

## 2012-10-07 ENCOUNTER — Other Ambulatory Visit: Payer: BC Managed Care – PPO

## 2013-01-07 ENCOUNTER — Telehealth: Payer: Self-pay

## 2013-01-07 NOTE — Telephone Encounter (Signed)
Pt request refill Benicar; pt should have one more refill at [facility]; pt will contact [facility] for refill.

## 2013-05-05 ENCOUNTER — Other Ambulatory Visit: Payer: Self-pay

## 2013-05-05 NOTE — Telephone Encounter (Signed)
Schedule PE when able and refill until then-thanks

## 2013-05-05 NOTE — Telephone Encounter (Signed)
Pt left v/m requesting refill Benicar HCT to [facility]. Pt last seen 04/09/12 and no future appt scheduled.Please advise.

## 2013-05-07 MED ORDER — OLMESARTAN MEDOXOMIL-HCTZ 40-25 MG PO TABS: 1.0000 | ORAL_TABLET | Freq: Every day | ORAL | Status: DC

## 2013-05-07 NOTE — Telephone Encounter (Signed)
Left voicemail requesting pt to call office 

## 2013-05-07 NOTE — Telephone Encounter (Signed)
CPE scheduled and med refilled  

## 2013-06-04 ENCOUNTER — Ambulatory Visit (INDEPENDENT_AMBULATORY_CARE_PROVIDER_SITE_OTHER): Payer: Federal, State, Local not specified - PPO | Admitting: Family Medicine

## 2013-06-04 ENCOUNTER — Encounter: Payer: Self-pay | Admitting: Family Medicine

## 2013-06-04 VITALS — BP 126/74 | HR 78 | Temp 98.3°F | Ht 66.5 in | Wt 175.0 lb

## 2013-06-04 DIAGNOSIS — E079 Disorder of thyroid, unspecified: Secondary | ICD-10-CM

## 2013-06-04 DIAGNOSIS — E785 Hyperlipidemia, unspecified: Secondary | ICD-10-CM

## 2013-06-04 DIAGNOSIS — I10 Essential (primary) hypertension: Secondary | ICD-10-CM

## 2013-06-04 DIAGNOSIS — Z0000 Encounter for general adult medical examination without abnormal findings: Secondary | ICD-10-CM

## 2013-06-04 DIAGNOSIS — K219 Gastro-esophageal reflux disease without esophagitis: Secondary | ICD-10-CM

## 2013-06-04 NOTE — Patient Instructions (Signed)
Please send for last colonoscopy - from [place] Please schedule fasting labs when able  Work on healthy diet   (Avoid red meat/ fried foods/ egg yolks/ fatty breakfast meats/ butter, cheese and high fat dairy/ and shellfish  )-especially for cholesterol  Exercise is the key to keep up energy and metabolic rate -it also helps mood

## 2013-06-04 NOTE — Progress Notes (Signed)
Pre-visit discussion using our clinic review tool. No additional management support is needed unless otherwise documented below in the visit note.  

## 2013-06-04 NOTE — Progress Notes (Signed)
Subjective:    Patient ID: Caroline Francis, female    DOB: January 27, 1951, 62 y.o.   MRN: [id number]  HPI Here for health maintenance exam and to review chronic medical problems    Has been doing well overall  No new problems   Wt is up 9 lb with bmi of 27 She is aware - she is a boredom eater at times  Diet is so/so -not eating a lot of meat -just started eating chicken (not beef)  Inc her protein Not as much exercise as she should - really nothing    Colon cancer screen- had it as outpt in [facility]  (she thinks within the past 10 years)- thinks she is up to date with [place]    Pap 6/05 neg Had a partial hysterectomy for bleeding No gyn symptoms or problems    Flu vaccine - she declines flu vaccine   Td was 6/05  Zoster vaccine 10/13   Family hx   Due for labs - we will have her labs scheduled fasting  Ate a fried chicken sandwich today   Hx of low tsh Lab Results  Component Value Date   TSH 0.20* 04/03/2012   she was treated with radioactive Iodine - at [facility] - Dr Tedd Sias  Her levels are normal now  Has a scan planned in Feb and labs   Also had EGD for trouble swallowing - dx with esoph spasms and she had to get back on PPI- prevacid - and doing better   Hx of hyperlipidemia Lab Results  Component Value Date   CHOL 235* 04/03/2012   HDL 68.00 04/03/2012   LDLDIRECT 144.7 04/03/2012   TRIG 132.0 04/03/2012   CHOLHDL 3 04/03/2012     bp is stable today  No cp or palpitations or headaches or edema  No side effects to medicines - benicar hct  BP Readings from Last 3 Encounters:  06/04/13 126/74  04/09/12 118/82  10/24/11 110/70     Mood -up and down - lost her mother last May and still grieving but overall does not think she is depressed  Is tearful to talk about it   Patient Active Problem List   Diagnosis Date Noted  . External hemorrhoids 04/09/2012  . Other screening mammogram 04/09/2012  . Routine general medical examination at a health care facility  04/02/2012  . Acute sinusitis, unspecified 10/24/2011  . SNORING, HX OF 05/04/2010  . THYROID STIMULATING HORMONE, ABNORMAL 03/31/2010  . SHOULDER STRAIN 03/18/2008  . HYPERLIPIDEMIA 12/25/2007  . HYPERTENSION 12/25/2007  . GERD 12/25/2007  . HIATAL HERNIA 12/25/2007   Past Medical History  Diagnosis Date  . GERD (gastroesophageal reflux disease)   . Hyperlipidemia   . Hypertension   . Low TSH level    Past Surgical History  Procedure Laterality Date  . Abdominal hysterectomy      partial /bleeding  . Nasal septum surgery     History  Substance Use Topics  . Smoking status: Never Smoker   . Smokeless tobacco: Never Used  . Alcohol Use: Yes     Comment: occasional glass of wine   No family history on file. Allergies  Allergen Reactions  . Lisinopril-Hydrochlorothiazide Cough    REACTION: side effect 03/30/10 Pt said not allergic and no adverse reactions to any meds.   Current Outpatient Prescriptions on File Prior to Visit  Medication Sig Dispense Refill  . lansoprazole (PREVACID) 15 MG capsule Take 15 mg by mouth daily as needed.      Marland Kitchen  olmesartan-hydrochlorothiazide (BENICAR HCT) 40-25 MG per tablet Take 1 tablet by mouth daily.  90 tablet  1   No current facility-administered medications on file prior to visit.     Review of Systems Review of Systems  Constitutional: Negative for fever, appetite change, fatigue and unexpected weight change.  Eyes: Negative for pain and visual disturbance.  Respiratory: Negative for cough and shortness of breath.   Cardiovascular: Negative for cp or palpitations    Gastrointestinal: Negative for nausea, diarrhea and constipation. neg for heartburn now on ppi Genitourinary: Negative for urgency and frequency.  Skin: Negative for pallor or rash   Neurological: Negative for weakness, light-headedness, numbness and headaches.  Hematological: Negative for adenopathy. Does not bruise/bleed easily.  Psychiatric/Behavioral: Negative  for dysphoric mood. The patient is not nervous/anxious.  pos for some grief        Objective:   Physical Exam  Constitutional: She appears well-developed and well-nourished. No distress.  HENT:  Head: Normocephalic and atraumatic.  Right Ear: External ear normal.  Left Ear: External ear normal.  Mouth/Throat: Oropharynx is clear and moist.  Eyes: Conjunctivae and EOM are normal. Pupils are equal, round, and reactive to light. No scleral icterus.  Neck: Normal range of motion. Neck supple. No JVD present. Carotid bruit is not present. No thyromegaly present.  Cardiovascular: Normal rate, regular rhythm, normal heart sounds and intact distal pulses.  Exam reveals no gallop.   Pulmonary/Chest: Effort normal and breath sounds normal. No respiratory distress. She has no wheezes. She exhibits no tenderness.  Abdominal: Soft. Bowel sounds are normal. She exhibits no distension, no abdominal bruit and no mass. There is no tenderness.  Genitourinary: No breast swelling, tenderness, discharge or bleeding.  Breast exam: No mass, nodules, thickening, tenderness, bulging, retraction, inflamation, nipple discharge or skin changes noted.  No axillary or clavicular LA.     Musculoskeletal: Normal range of motion. She exhibits no edema and no tenderness.  Lymphadenopathy:    She has no cervical adenopathy.  Neurological: She is alert. She has normal reflexes. No cranial nerve deficit. She exhibits normal muscle tone. Coordination normal.  Skin: Skin is warm and dry. No rash noted. No erythema. No pallor.  Lentigos/ skin tags on back  Ruddy complexion  Psychiatric: She has a normal mood and affect.          Assessment & Plan:

## 2013-06-05 NOTE — Assessment & Plan Note (Signed)
Disc goals for lipids and reasons to control them Rev labs with pt from last draw  Rev low sat fat diet in detail - she agrees to make some changes  Scheduled fasting labs

## 2013-06-05 NOTE — Assessment & Plan Note (Signed)
Doing well now after EGD-no more dysphagia Back on PPI indefinately

## 2013-06-05 NOTE — Assessment & Plan Note (Signed)
Reviewed health habits including diet and exercise and skin cancer prevention Reviewed appropriate screening tests for age  Also reviewed health mt list, fam hx and immunization status , as well as social and family history    Fasting labs planned  Called for last colonosc Disc breast cancer screening

## 2013-06-05 NOTE — Assessment & Plan Note (Signed)
BP: 126/74 mmHg  bp in fair control at this time  No changes needed  Disc lifstyle change with low sodium diet and exercise  Labs planned

## 2013-06-05 NOTE — Assessment & Plan Note (Signed)
Seeing endo/ Dr Tedd Sias for RI treatments

## 2013-07-02 ENCOUNTER — Telehealth: Payer: Self-pay | Admitting: *Deleted

## 2013-07-02 NOTE — Telephone Encounter (Signed)
Per Dr. Milinda Antis, left voicemail letting pt know she is up to date on her colonoscopy and will not be due for her next one until after 07/08/15

## 2013-10-21 ENCOUNTER — Other Ambulatory Visit: Payer: Self-pay | Admitting: Family Medicine

## 2014-01-01 ENCOUNTER — Telehealth: Payer: Self-pay

## 2014-01-01 MED ORDER — LANSOPRAZOLE 30 MG PO CPDR: 30.0000 mg | DELAYED_RELEASE_CAPSULE | Freq: Every day | ORAL | Status: DC | PRN

## 2014-01-01 NOTE — Telephone Encounter (Signed)
Okay to fill and then f/u with PCP.  Thanks.

## 2014-01-01 NOTE — Telephone Encounter (Signed)
Pt left v/m requesting refill on lansoprazole 30 mg to [facility]; on pt med list has 15 mg but pt said Dr Vira Agar had increased to 30 mg. Pt was receiving lansoprazole from Dr Vira Agar but pt no longer seeing him and request refill from PCP. Pt request cb if not able to refill med.

## 2014-01-01 NOTE — Telephone Encounter (Signed)
Rx sent through e-scribe Left detailed msg on VM per HIPAA with information as instructed below

## 2014-01-23 ENCOUNTER — Other Ambulatory Visit: Payer: Self-pay

## 2014-01-23 MED ORDER — OLMESARTAN MEDOXOMIL-HCTZ 40-25 MG PO TABS: ORAL_TABLET | ORAL | Status: DC

## 2014-01-23 NOTE — Telephone Encounter (Signed)
Pt request refill benicar-HCTZ to [facility].Advised pt done and pt will cb to schedule CPX with Dr Glori Bickers after 06/04/2014.

## 2014-04-08 ENCOUNTER — Ambulatory Visit: Payer: Federal, State, Local not specified - PPO | Admitting: Family Medicine

## 2014-04-15 ENCOUNTER — Ambulatory Visit (INDEPENDENT_AMBULATORY_CARE_PROVIDER_SITE_OTHER): Payer: Federal, State, Local not specified - PPO | Admitting: Family Medicine

## 2014-04-15 ENCOUNTER — Encounter: Payer: Self-pay | Admitting: Family Medicine

## 2014-04-15 VITALS — BP 142/92 | HR 67 | Temp 98.7°F | Ht 66.5 in | Wt 171.0 lb

## 2014-04-15 DIAGNOSIS — Z23 Encounter for immunization: Secondary | ICD-10-CM

## 2014-04-15 DIAGNOSIS — E785 Hyperlipidemia, unspecified: Secondary | ICD-10-CM

## 2014-04-15 DIAGNOSIS — I10 Essential (primary) hypertension: Secondary | ICD-10-CM

## 2014-04-15 MED ORDER — OLMESARTAN MEDOXOMIL-HCTZ 40-25 MG PO TABS: ORAL_TABLET | ORAL | Status: DC

## 2014-04-15 MED ORDER — LANSOPRAZOLE 30 MG PO CPDR
30.0000 mg | DELAYED_RELEASE_CAPSULE | Freq: Every day | ORAL | Status: DC | PRN
Start: 2014-04-15 — End: 2015-04-07

## 2014-04-15 NOTE — Progress Notes (Signed)
Subjective:    Patient ID: Caroline Francis, female    DOB: [birth year], 63 y.o.   MRN: [id number]  HPI Here for f/u of chronic medical problems   Has been working a lot  Doing well overall  No new medical problems   Wt is down 4 lb -- since last time she was here  Has been more active - has to push mow her yard  bmi is 27  bp is up a bit today - she forgot to take her pill this am (unusual today) No cp or palpitations or headaches or edema  No side effects to medicines  BP Readings from Last 3 Encounters:  04/15/14 142/92  06/04/13 126/74  04/09/12 118/82       Chemistry      Component Value Date/Time   NA 140 04/03/2012 0817   K 3.8 04/03/2012 0817   CL 103 04/03/2012 0817   CO2 29 04/03/2012 0817   BUN 8 04/03/2012 0817   CREATININE 0.7 04/03/2012 0817      Component Value Date/Time   CALCIUM 9.6 04/03/2012 0817   ALKPHOS 61 04/03/2012 0817   AST 19 04/03/2012 0817   ALT 20 04/03/2012 0817   BILITOT 0.9 04/03/2012 0817      Lab Results  Component Value Date   CHOL 235* 04/03/2012   HDL 68.00 04/03/2012   LDLDIRECT 144.7 04/03/2012   TRIG 132.0 04/03/2012   CHOLHDL 3 04/03/2012     Last mammogram- ? 2013 , - will schedule that herself   Tdap-is due - has been 10 years   Flu vaccine - declines   Due for labs  Diet is fair - - she tries to stay away from red meat and fatty/fried foods   Patient Active Problem List   Diagnosis Date Noted  . External hemorrhoids 04/09/2012  . Routine general medical examination at a health care facility 04/02/2012  . SNORING, HX OF 05/04/2010  . Hyperthyroidism 03/31/2010  . HYPERLIPIDEMIA 12/25/2007  . HYPERTENSION 12/25/2007  . GERD 12/25/2007  . HIATAL HERNIA 12/25/2007   Past Medical History  Diagnosis Date  . GERD (gastroesophageal reflux disease)   . Hyperlipidemia   . Hypertension   . Low TSH level    Past Surgical History  Procedure Laterality Date  . Abdominal hysterectomy      partial /bleeding  . Nasal septum  surgery     History  Substance Use Topics  . Smoking status: Never Smoker   . Smokeless tobacco: Never Used  . Alcohol Use: Yes     Comment: occasional glass of wine   No family history on file. Allergies  Allergen Reactions  . Lisinopril-Hydrochlorothiazide Cough    REACTION: side effect 03/30/10 Pt said not allergic and no adverse reactions to any meds.   Current Outpatient Prescriptions on File Prior to Visit  Medication Sig Dispense Refill  . lansoprazole (PREVACID) 30 MG capsule Take 1 capsule (30 mg total) by mouth daily as needed.  90 capsule  0  . olmesartan-hydrochlorothiazide (BENICAR HCT) 40-25 MG per tablet TAKE 1 TABLET DAILY  90 tablet  1   No current facility-administered medications on file prior to visit.     Review of Systems Review of Systems  Constitutional: Negative for fever, appetite change, fatigue and unexpected weight change.  Eyes: Negative for pain and visual disturbance.  Respiratory: Negative for cough and shortness of breath.   Cardiovascular: Negative for cp or palpitations    Gastrointestinal: Negative for  nausea, diarrhea and constipation.  Genitourinary: Negative for urgency and frequency.  Skin: Negative for pallor or rash   Neurological: Negative for weakness, light-headedness, numbness and headaches.  Hematological: Negative for adenopathy. Does not bruise/bleed easily.  Psychiatric/Behavioral: Negative for dysphoric mood. The patient is not nervous/anxious.         Objective:   Physical Exam  Constitutional: She appears well-developed and well-nourished. No distress.  HENT:  Head: Normocephalic and atraumatic. Not macrocephalic.  Right Ear: External ear normal.  Left Ear: External ear normal.  Nose: Nose normal.  Mouth/Throat: Oropharynx is clear and moist.  Eyes: Conjunctivae and EOM are normal. Pupils are equal, round, and reactive to light. Right eye exhibits no discharge. Left eye exhibits no discharge. No scleral icterus.    Neck: Normal range of motion. Neck supple. No JVD present. No thyromegaly present.  Cardiovascular: Normal rate, regular rhythm, normal heart sounds and intact distal pulses.  Exam reveals no gallop.   Pulmonary/Chest: Effort normal and breath sounds normal. No respiratory distress. She has no wheezes. She has no rales.  Abdominal: Soft. Bowel sounds are normal. She exhibits no distension, no abdominal bruit and no mass. There is no tenderness.  Musculoskeletal: She exhibits no edema and no tenderness.  Lymphadenopathy:    She has no cervical adenopathy.  Neurological: She is alert. She has normal reflexes. No cranial nerve deficit. She exhibits normal muscle tone. Coordination normal.  Skin: Skin is warm and dry. No rash noted. No erythema. No pallor.  Psychiatric: She has a normal mood and affect.          Assessment & Plan:   Problem List Items Addressed This Visit     Cardiovascular and Mediastinum   HYPERTENSION - Primary      bp is up a bit because she forgot her medication this am  Has been in good control  bp in fair control at this time  BP Readings from Last 1 Encounters:  04/15/14 142/92  (without med today) No changes needed Disc lifstyle change with low sodium diet and exercise   Labs today    Relevant Medications      olmesartan-hydrochlorothiazide (BENICAR HCT) 40-25 MG per tablet   Other Relevant Orders      CBC with Differential      Comprehensive metabolic panel      TSH      Lipid panel     Other   HYPERLIPIDEMIA     Disc goals for lipids and reasons to control them Rev labs with pt- drom last draw  She has cut down fatty foods  Check lipid panel today Rev low sat fat diet in detail     Relevant Medications      olmesartan-hydrochlorothiazide (BENICAR HCT) 40-25 MG per tablet   Other Relevant Orders      Lipid panel    Other Visit Diagnoses   Need for prophylactic vaccination with combined diphtheria-tetanus-pertussis (DTP) vaccine         Relevant Orders       Tdap vaccine greater than or equal to 7yo IM (Completed)

## 2014-04-15 NOTE — Progress Notes (Signed)
Pre visit review using our clinic review tool, if applicable. No additional management support is needed unless otherwise documented below in the visit note. 

## 2014-04-15 NOTE — Patient Instructions (Signed)
Tdap vaccine today (tetanus)  Stay on current blood pressure medicine  Labs today Work on healthy exercise and diet  Avoid red meat/ fried foods/ egg yolks/ fatty breakfast meats/ butter, cheese and high fat dairy/ and shellfish  - for cholesterol

## 2014-04-15 NOTE — Assessment & Plan Note (Signed)
Disc goals for lipids and reasons to control them Rev labs with pt- drom last draw  She has cut down fatty foods  Check lipid panel today Rev low sat fat diet in detail

## 2014-04-15 NOTE — Assessment & Plan Note (Signed)
bp is up a bit because she forgot her medication this am  Has been in good control  bp in fair control at this time  BP Readings from Last 1 Encounters:  04/15/14 142/92  (without med today) No changes needed Disc lifstyle change with low sodium diet and exercise   Labs today

## 2014-04-16 LAB — CBC WITH DIFFERENTIAL/PLATELET
BASOS PCT: 2.4 % (ref 0.0–3.0)
Basophils Absolute: 0.2 10*3/uL — ABNORMAL HIGH (ref 0.0–0.1)
EOS ABS: 0.1 10*3/uL (ref 0.0–0.7)
Eosinophils Relative: 1.7 % (ref 0.0–5.0)
HCT: 40.5 % (ref 36.0–46.0)
HEMOGLOBIN: 13.7 g/dL (ref 12.0–15.0)
LYMPHS ABS: 2.4 10*3/uL (ref 0.7–4.0)
LYMPHS PCT: 31.3 % (ref 12.0–46.0)
MCHC: 33.7 g/dL (ref 30.0–36.0)
MCV: 94.5 fl (ref 78.0–100.0)
MONOS PCT: 5.4 % (ref 3.0–12.0)
Monocytes Absolute: 0.4 10*3/uL (ref 0.1–1.0)
NEUTROS ABS: 4.6 10*3/uL (ref 1.4–7.7)
Neutrophils Relative %: 59.2 % (ref 43.0–77.0)
Platelets: 267 10*3/uL (ref 150.0–400.0)
RBC: 4.29 Mil/uL (ref 3.87–5.11)
RDW: 12.4 % (ref 11.5–15.5)
WBC: 7.8 10*3/uL (ref 4.0–10.5)

## 2014-04-16 LAB — COMPREHENSIVE METABOLIC PANEL
ALT: 21 U/L (ref 0–35)
AST: 21 U/L (ref 0–37)
Albumin: 4.1 g/dL (ref 3.5–5.2)
Alkaline Phosphatase: 63 U/L (ref 39–117)
BUN: 16 mg/dL (ref 6–23)
CHLORIDE: 104 meq/L (ref 96–112)
CO2: 27 meq/L (ref 19–32)
CREATININE: 0.8 mg/dL (ref 0.4–1.2)
Calcium: 9.5 mg/dL (ref 8.4–10.5)
GFR: 78.11 mL/min (ref >60.00)
Glucose, Bld: 94 mg/dL (ref 70–99)
Potassium: 3.7 mEq/L (ref 3.5–5.1)
Sodium: 138 mEq/L (ref 135–145)
Total Bilirubin: 0.5 mg/dL (ref 0.2–1.2)
Total Protein: 6.7 g/dL (ref 6.0–8.3)

## 2014-04-16 LAB — LIPID PANEL
CHOLESTEROL: 243 mg/dL — AB (ref 0–200)
HDL: 57.7 mg/dL (ref >39.00)
NonHDL: 185.3
TRIGLYCERIDES: 255 mg/dL — AB (ref 0.0–149.0)
Total CHOL/HDL Ratio: 4
VLDL: 51 mg/dL — ABNORMAL HIGH (ref 0.0–40.0)

## 2014-04-16 LAB — TSH: TSH: 0.76 u[IU]/mL (ref 0.35–4.50)

## 2014-04-16 LAB — LDL CHOLESTEROL, DIRECT: Direct LDL: 144.2 mg/dL

## 2014-04-20 ENCOUNTER — Encounter: Payer: Self-pay | Admitting: *Deleted

## 2014-11-27 ENCOUNTER — Telehealth: Payer: Self-pay | Admitting: *Deleted

## 2014-11-27 NOTE — Telephone Encounter (Signed)
Called patient to schedule screening mammogram.  Patient states she has not had one within the past two years.  Contact information given for [facility].  She will call to schedule.

## 2015-03-15 ENCOUNTER — Telehealth: Payer: Self-pay

## 2015-03-15 NOTE — Telephone Encounter (Signed)
Unable to leave message for patient about being due for a Mammogram.

## 2015-04-07 ENCOUNTER — Telehealth: Payer: Self-pay | Admitting: *Deleted

## 2015-04-07 MED ORDER — LANSOPRAZOLE 30 MG PO CPDR: 30.0000 mg | DELAYED_RELEASE_CAPSULE | Freq: Every day | ORAL | Status: DC | PRN

## 2015-04-07 MED ORDER — OLMESARTAN MEDOXOMIL-HCTZ 40-25 MG PO TABS
ORAL_TABLET | ORAL | Status: DC
Start: 2015-04-07 — End: 2015-07-23

## 2015-04-07 NOTE — Telephone Encounter (Signed)
Received a fax from [facility] saying pt needs refills of a med, it doesn't list med name only a order # [phone])  Pt is due for a med refill appt she hasn't been seen in over 1 year  Called pt to ask the name of med she needs refilled and to schedule a f/u appt with her but no answer so left voicemail requesting pt to call office back

## 2015-04-07 NOTE — Telephone Encounter (Signed)
Pt left v/m returning call and request cb. 

## 2015-04-07 NOTE — Telephone Encounter (Signed)
appt scheduled and med refilled 

## 2015-04-12 ENCOUNTER — Telehealth: Payer: Self-pay | Admitting: *Deleted

## 2015-04-12 NOTE — Telephone Encounter (Signed)
Left message for pt to call back and advise if she has had mammogram yet.

## 2015-05-04 ENCOUNTER — Ambulatory Visit: Payer: Federal, State, Local not specified - PPO | Admitting: Family Medicine

## 2015-05-21 ENCOUNTER — Encounter: Payer: Self-pay | Admitting: Gastroenterology

## 2015-07-23 ENCOUNTER — Encounter: Payer: Self-pay | Admitting: Family Medicine

## 2015-07-23 ENCOUNTER — Ambulatory Visit (INDEPENDENT_AMBULATORY_CARE_PROVIDER_SITE_OTHER)
Admission: RE | Admit: 2015-07-23 | Discharge: 2015-07-23 | Disposition: A | Discharge status: Home / Self Care | Payer: Federal, State, Local not specified - PPO | Source: Ambulatory Visit | Attending: Family Medicine | Admitting: Family Medicine

## 2015-07-23 ENCOUNTER — Ambulatory Visit (INDEPENDENT_AMBULATORY_CARE_PROVIDER_SITE_OTHER): Payer: Federal, State, Local not specified - PPO | Admitting: Family Medicine

## 2015-07-23 VITALS — BP 128/85 | HR 82 | Temp 98.0°F | Ht 66.5 in | Wt 168.8 lb

## 2015-07-23 DIAGNOSIS — I10 Essential (primary) hypertension: Secondary | ICD-10-CM

## 2015-07-23 DIAGNOSIS — E785 Hyperlipidemia, unspecified: Secondary | ICD-10-CM | POA: Diagnosis not present

## 2015-07-23 DIAGNOSIS — M25552 Pain in left hip: Secondary | ICD-10-CM

## 2015-07-23 LAB — COMPREHENSIVE METABOLIC PANEL
ALBUMIN: 4.3 g/dL (ref 3.6–5.1)
ALT: 17 U/L (ref 6–29)
AST: 15 U/L (ref 10–35)
Alkaline Phosphatase: 71 U/L (ref 33–130)
BILIRUBIN TOTAL: 0.4 mg/dL (ref 0.2–1.2)
BUN: 18 mg/dL (ref 7–25)
CO2: 27 mmol/L (ref 20–31)
CREATININE: 0.89 mg/dL (ref 0.50–0.99)
Calcium: 9.3 mg/dL (ref 8.6–10.4)
Chloride: 99 mmol/L (ref 98–110)
Glucose, Bld: 91 mg/dL (ref 65–99)
Potassium: 3.7 mmol/L (ref 3.5–5.3)
SODIUM: 139 mmol/L (ref 135–146)
TOTAL PROTEIN: 6.2 g/dL (ref 6.1–8.1)

## 2015-07-23 LAB — CBC WITH DIFFERENTIAL/PLATELET
Basophils Absolute: 0 10*3/uL (ref 0.0–0.1)
Basophils Relative: 0 % (ref 0–1)
EOS PCT: 1 % (ref 0–5)
Eosinophils Absolute: 0.1 10*3/uL (ref 0.0–0.7)
HEMATOCRIT: 40.6 % (ref 36.0–46.0)
HEMOGLOBIN: 14 g/dL (ref 12.0–15.0)
LYMPHS ABS: 2.1 10*3/uL (ref 0.7–4.0)
LYMPHS PCT: 22 % (ref 12–46)
MCH: 31.5 pg (ref 26.0–34.0)
MCHC: 34.5 g/dL (ref 30.0–36.0)
MCV: 91.2 fL (ref 78.0–100.0)
MONO ABS: 0.6 10*3/uL (ref 0.1–1.0)
MONOS PCT: 6 % (ref 3–12)
MPV: 9.1 fL (ref 8.6–12.4)
NEUTROS ABS: 6.9 10*3/uL (ref 1.7–7.7)
Neutrophils Relative %: 71 % (ref 43–77)
Platelets: 315 10*3/uL (ref 150–400)
RBC: 4.45 MIL/uL (ref 3.87–5.11)
RDW: 12.8 % (ref 11.5–15.5)
WBC: 9.7 10*3/uL (ref 4.0–10.5)

## 2015-07-23 LAB — LIPID PANEL
CHOL/HDL RATIO: 3.6 ratio (ref <=5.0)
CHOLESTEROL: 259 mg/dL — AB (ref 125–200)
HDL: 71 mg/dL (ref >=46)
LDL Cholesterol: 127 mg/dL (ref <130)
Triglycerides: 307 mg/dL — ABNORMAL HIGH (ref <150)
VLDL: 61 mg/dL — AB (ref <30)

## 2015-07-23 MED ORDER — LANSOPRAZOLE 30 MG PO CPDR: 30.0000 mg | DELAYED_RELEASE_CAPSULE | Freq: Every day | ORAL | Status: DC | PRN

## 2015-07-23 MED ORDER — OLMESARTAN MEDOXOMIL-HCTZ 40-25 MG PO TABS: ORAL_TABLET | ORAL | Status: DC

## 2015-07-23 NOTE — Progress Notes (Signed)
Subjective:    Patient ID: Caroline Francis, female    DOB: 05/24/1951, 65 y.o.   MRN: [id number]  HPI Here for f/u of chronic health problems   Wt is down 3 lb with bmi of 26   Her L leg has been giving her pain  Groin area to the knee  Hurts to walk   bp is up a bit today  Does not check at home  No cp or palpitations or headaches or edema  No side effects to medicines  BP Readings from Last 3 Encounters:  07/23/15 130/98  04/15/14 142/92  06/04/13 126/74    Takes benicar hct   Not enough exercise   Due for labs - has been over a year   Eating - a bit better -has made the effort  Has cut out soft drinks and tea (sweet)  More water   Cholesterol Lab Results  Component Value Date   CHOL 243* 04/15/2014   HDL 57.70 04/15/2014   LDLDIRECT 144.2 04/15/2014   TRIG 255.0* 04/15/2014   CHOLHDL 4 04/15/2014     Patient Active Problem List   Diagnosis Date Noted  . Pain in left hip 07/23/2015  . External hemorrhoids 04/09/2012  . Routine general medical examination at a health care facility 04/02/2012  . SNORING, HX OF 05/04/2010  . Hyperthyroidism 03/31/2010  . Hyperlipidemia 12/25/2007  . Essential hypertension 12/25/2007  . GERD 12/25/2007  . HIATAL HERNIA 12/25/2007   Past Medical History  Diagnosis Date  . GERD (gastroesophageal reflux disease)   . Hyperlipidemia   . Hypertension   . Low TSH level    Past Surgical History  Procedure Laterality Date  . Abdominal hysterectomy      partial /bleeding  . Nasal septum surgery     Social History  Substance Use Topics  . Smoking status: Never Smoker   . Smokeless tobacco: Never Used  . Alcohol Use: Yes     Comment: occasional glass of wine   No family history on file. Allergies  Allergen Reactions  . Lisinopril-Hydrochlorothiazide Cough    REACTION: side effect 03/30/10 Pt said not allergic and no adverse reactions to any meds.   No current outpatient prescriptions on file prior to visit.   No  current facility-administered medications on file prior to visit.    Review of Systems Review of Systems  Constitutional: Negative for fever, appetite change, fatigue and unexpected weight change.  Eyes: Negative for pain and visual disturbance.  Respiratory: Negative for cough and shortness of breath.   Cardiovascular: Negative for cp or palpitations    Gastrointestinal: Negative for nausea, diarrhea and constipation.  Genitourinary: Negative for urgency and frequency.  Skin: Negative for pallor or rash   MSK pos for L groin pain with walking Neurological: Negative for weakness, light-headedness, numbness and headaches.  Hematological: Negative for adenopathy. Does not bruise/bleed easily.  Psychiatric/Behavioral: Negative for dysphoric mood. The patient is not nervous/anxious.         Objective:   Physical Exam  Constitutional: She appears well-developed and well-nourished. No distress.  Well appearing   HENT:  Head: Normocephalic and atraumatic.  Mouth/Throat: Oropharynx is clear and moist.  Eyes: Conjunctivae and EOM are normal. Pupils are equal, round, and reactive to light.  Neck: Normal range of motion. Neck supple. No JVD present. Carotid bruit is not present. No thyromegaly present.  Cardiovascular: Normal rate, regular rhythm, normal heart sounds and intact distal pulses.  Exam reveals no gallop.  Pulmonary/Chest: Effort normal and breath sounds normal. No respiratory distress. She has no wheezes. She has no rales.  No crackles  Abdominal: Soft. Bowel sounds are normal. She exhibits no distension, no abdominal bruit and no mass. There is no tenderness.  Musculoskeletal: She exhibits no edema or tenderness.  Pain in L groin with int and ext rot of L hip Nl flex and extension No trochanteric tenderness  Nl rom L knee w/o tenderness  Lymphadenopathy:    She has no cervical adenopathy.  Neurological: She is alert. She has normal reflexes.  Skin: Skin is warm and dry. No  rash noted.  Psychiatric: She has a normal mood and affect.          Assessment & Plan:   Problem List Items Addressed This Visit      Cardiovascular and Mediastinum   Essential hypertension - Primary    bp is better on 2nd check  bp in fair control at this time  BP Readings from Last 1 Encounters:  07/23/15 128/85   No changes needed Disc lifstyle change with low sodium diet and exercise   Given handout on DASH diet  Lab today      Relevant Medications   olmesartan-hydrochlorothiazide (BENICAR HCT) 40-25 MG tablet   Other Relevant Orders   CBC with Differential/Platelet (Completed)   Comprehensive metabolic panel (Completed)   TSH (Completed)   Lipid panel (Completed)     Other   Hyperlipidemia    Lab today  Diet could be better  Disc goals for lipids and reasons to control them Rev labs with pt from last check Rev low sat fat diet in detail       Relevant Medications   olmesartan-hydrochlorothiazide (BENICAR HCT) 40-25 MG tablet   Other Relevant Orders   Lipid panel (Completed)   Pain in left hip    Pt has pain in L groin with walking and movement  Xray today to r/o hip OA      Relevant Orders   DG HIP UNILAT WITH PELVIS 2-3 VIEWS LEFT (Completed)

## 2015-07-23 NOTE — Patient Instructions (Signed)
Lab today  Take care of yourself Eat a low fat / low salt diet  Exercise as tolerated Hip xray today

## 2015-07-23 NOTE — Progress Notes (Signed)
Pre visit review using our clinic review tool, if applicable. No additional management support is needed unless otherwise documented below in the visit note. 

## 2015-07-24 LAB — TSH: TSH: 1.121 u[IU]/mL (ref 0.350–4.500)

## 2015-07-25 NOTE — Assessment & Plan Note (Signed)
Lab today  Diet could be better  Disc goals for lipids and reasons to control them Rev labs with pt from last check Rev low sat fat diet in detail

## 2015-07-25 NOTE — Assessment & Plan Note (Addendum)
bp is better on 2nd check  bp in fair control at this time  BP Readings from Last 1 Encounters:  07/23/15 128/85   No changes needed Disc lifstyle change with low sodium diet and exercise   Given handout on DASH diet  Lab today

## 2015-07-25 NOTE — Assessment & Plan Note (Signed)
Pt has pain in L groin with walking and movement  Xray today to r/o hip OA

## 2015-07-26 ENCOUNTER — Encounter: Payer: Self-pay | Admitting: Family Medicine

## 2015-07-26 DIAGNOSIS — M169 Osteoarthritis of hip, unspecified: Secondary | ICD-10-CM | POA: Insufficient documentation

## 2015-07-27 ENCOUNTER — Telehealth: Payer: Self-pay | Admitting: Family Medicine

## 2015-07-27 DIAGNOSIS — M160 Bilateral primary osteoarthritis of hip: Secondary | ICD-10-CM

## 2015-07-27 NOTE — Telephone Encounter (Signed)
Pt called back and would like a referral for orthopedic in [place] / lt

## 2015-07-27 NOTE — Telephone Encounter (Signed)
More than one in [place] She would need to see orthopedics first to see if she is a candidate (if it would help)- then they refer for the injection if needed

## 2015-07-27 NOTE — Telephone Encounter (Signed)
Ortho ref for hip OA Will route to [facility]

## 2015-07-27 NOTE — Telephone Encounter (Signed)
Patient was seen last Friday for arthritis in her hip.  Dr.Tower asked patient if she'd be interested in seeing an Orthopaedic for injections.  Patient wants to know if Dr.Tower knows of a radiologist who does the injections.

## 2015-07-27 NOTE — Telephone Encounter (Signed)
Left voicemail requesting pt to call office back 

## 2015-07-28 ENCOUNTER — Telehealth: Payer: Self-pay | Admitting: Family Medicine

## 2015-07-28 ENCOUNTER — Telehealth: Payer: Self-pay | Admitting: *Deleted

## 2015-07-28 NOTE — Telephone Encounter (Signed)
Pt advise me that Dr. Posey Pronto was a radiologist, pt advise again that we can't directly refer pt to a radiologist that she has to see ortho 1st and if she is a candidate for the injections with the radiologist that they would refer her to the radiologist, pt still does want referral to Ortho 1st

## 2015-07-28 NOTE — Telephone Encounter (Signed)
Thanks-I did the referral so our office should be getting back to her about that

## 2015-07-28 NOTE — Telephone Encounter (Signed)
Is there a Dr Posey Pronto for ortho? I know there is a neurologist - who/what practice does she mean? Thanks

## 2015-07-28 NOTE — Telephone Encounter (Signed)
Pt called wanting to get a referral to dr patel for hip pain [facility] Tuesday Wednesday any time [facility] Monday Thursday or Friday after 3

## 2015-07-29 NOTE — Telephone Encounter (Signed)
Called patient and LMOM

## 2015-08-04 ENCOUNTER — Telehealth: Payer: Self-pay | Admitting: Family Medicine

## 2015-08-04 NOTE — Telephone Encounter (Signed)
Pt [facility] off handicap [facility]. Please call when to be picked up.   Thank you

## 2015-08-05 NOTE — Telephone Encounter (Signed)
Form in your inbox 

## 2015-08-06 NOTE — Telephone Encounter (Signed)
Done and in IN box 

## 2015-08-06 NOTE — Telephone Encounter (Signed)
Pt notified form ready for pick up 

## 2016-06-05 ENCOUNTER — Other Ambulatory Visit: Payer: Self-pay | Admitting: Family Medicine

## 2016-06-14 ENCOUNTER — Ambulatory Visit (INDEPENDENT_AMBULATORY_CARE_PROVIDER_SITE_OTHER): Payer: Federal, State, Local not specified - PPO | Admitting: Family Medicine

## 2016-06-14 ENCOUNTER — Encounter: Payer: Self-pay | Admitting: Family Medicine

## 2016-06-14 VITALS — BP 130/80 | HR 82 | Temp 99.1°F | Ht 66.5 in | Wt 165.8 lb

## 2016-06-14 DIAGNOSIS — K219 Gastro-esophageal reflux disease without esophagitis: Secondary | ICD-10-CM | POA: Diagnosis not present

## 2016-06-14 DIAGNOSIS — M160 Bilateral primary osteoarthritis of hip: Secondary | ICD-10-CM | POA: Diagnosis not present

## 2016-06-14 DIAGNOSIS — E7800 Pure hypercholesterolemia, unspecified: Secondary | ICD-10-CM | POA: Diagnosis not present

## 2016-06-14 DIAGNOSIS — I10 Essential (primary) hypertension: Secondary | ICD-10-CM | POA: Diagnosis not present

## 2016-06-14 MED ORDER — LANSOPRAZOLE 30 MG PO CPDR: 30.0000 mg | DELAYED_RELEASE_CAPSULE | Freq: Two times a day (BID) | ORAL | 3 refills | Status: DC

## 2016-06-14 MED ORDER — MELOXICAM 15 MG PO TABS: 15.0000 mg | ORAL_TABLET | Freq: Every day | ORAL | 2 refills | Status: DC | PRN

## 2016-06-14 MED ORDER — LANSOPRAZOLE 30 MG PO CPDR: 30.0000 mg | DELAYED_RELEASE_CAPSULE | Freq: Two times a day (BID) | ORAL | 0 refills | Status: DC

## 2016-06-14 MED ORDER — OLMESARTAN MEDOXOMIL-HCTZ 40-25 MG PO TABS: 1.0000 | ORAL_TABLET | Freq: Every day | ORAL | 3 refills | Status: DC

## 2016-06-14 NOTE — Assessment & Plan Note (Signed)
Pt undergoing injections-and not improving  Not able to work or walk  Water exercise helps  Will get a cane  Px meloxicam 15 mg with food prn and f/u with ortho as planned Sounds like she will need a hip repl in the future

## 2016-06-14 NOTE — Assessment & Plan Note (Signed)
Better bp on 2nd check  BP: 130/80   Continue benicar hct  Rev last labs  Enc healthy diet  Continue water aerobics

## 2016-06-14 NOTE — Progress Notes (Signed)
Subjective:    Patient ID: Caroline Francis, female    DOB: October 02, 1950, 65 y.o.   MRN: [id number]  HPI Here for f/u of chronic medical problems   Wt Readings from Last 3 Encounters:  06/14/16 165 lb 12 oz (75.2 kg)  07/23/15 168 lb 12 oz (76.5 kg)  04/15/14 171 lb (77.6 kg)  down 3 lb on our scale  bmi is 26.3  bp is up on first check today No cp or palpitations or headaches or edema  No side effects to medicines  BP Readings from Last 3 Encounters:  06/14/16 (!) 146/94  07/23/15 128/85  04/15/14 (!) 142/92    2nd check was better BP: 130/80    Hx of gerd Takes prevacid 30 mg  She had a bout of heartburn and she had to double up for a short amount of time  She thinks she needs it twice daily  Has not been able to come off of it  Has appt with Dr Tiffany Kocher in January    Hx of hyperlipidemia Lab Results  Component Value Date   CHOL 259 (H) 07/23/2015   HDL 71 07/23/2015   LDLCALC 127 07/23/2015   LDLDIRECT 144.2 04/15/2014   TRIG 307 (H) 07/23/2015   CHOLHDL 3.6 07/23/2015   Diet controlled   Dealing with hip arthritis  Has had 2 injections - one worked and one did not  Has seen Dr Arvella Nigh and has appt for another doctor  Wondered if an anti inflammatory would help -like mobic - if it did not give her side effects  Will likely need joint replacement  Next appt is in January    She is disabled from work currently  Is able to do some water exercise for range of motion-but she cannot walk well Cannot sit to drive her car   Patient Active Problem List   Diagnosis Date Noted  . Osteoarthritis of hip 07/26/2015  . Pain in left hip 07/23/2015  . External hemorrhoids 04/09/2012  . Routine general medical examination at a health care facility 04/02/2012  . SNORING, HX OF 05/04/2010  . Hyperthyroidism 03/31/2010  . Hyperlipidemia 12/25/2007  . Essential hypertension 12/25/2007  . GERD 12/25/2007  . HIATAL HERNIA 12/25/2007   Past Medical History:  Diagnosis Date   . GERD (gastroesophageal reflux disease)   . Hyperlipidemia   . Hypertension   . Low TSH level    Past Surgical History:  Procedure Laterality Date  . ABDOMINAL HYSTERECTOMY     partial /bleeding  . NASAL SEPTUM SURGERY     Social History  Substance Use Topics  . Smoking status: Never Smoker  . Smokeless tobacco: Never Used  . Alcohol use Yes     Comment: occasional glass of wine   No family history on file. Allergies  Allergen Reactions  . Lisinopril-Hydrochlorothiazide Cough    REACTION: side effect 03/30/10 Pt said not allergic and no adverse reactions to any meds.   No current outpatient prescriptions on file prior to visit.   No current facility-administered medications on file prior to visit.     Review of Systems    Review of Systems  Constitutional: Negative for fever, appetite change, fatigue and unexpected weight change.  Eyes: Negative for pain and visual disturbance.  Respiratory: Negative for cough and shortness of breath.   Cardiovascular: Negative for cp or palpitations    Gastrointestinal: Negative for nausea, diarrhea and constipation. pos for heartburn on and off  Genitourinary: Negative for urgency  and frequency.  MSK pos for mod to severe hip pain with mobility impairment  Skin: Negative for pallor or rash   MSK pos for moderate to severe hip pain  Neurological: Negative for weakness, light-headedness, numbness and headaches.  Hematological: Negative for adenopathy. Does not bruise/bleed easily.  Psychiatric/Behavioral: Negative for dysphoric mood. The patient is not nervous/anxious.      Objective:   Physical Exam  Constitutional: She appears well-developed and well-nourished. No distress.  Well appearing   HENT:  Head: Normocephalic and atraumatic.  Mouth/Throat: Oropharynx is clear and moist.  Eyes: Conjunctivae and EOM are normal. Pupils are equal, round, and reactive to light. No scleral icterus.  Neck: Normal range of motion. Neck  supple.  Cardiovascular: Normal rate, regular rhythm and normal heart sounds.   Pulmonary/Chest: Effort normal and breath sounds normal. No respiratory distress. She has no wheezes. She has no rales.  Abdominal: Soft. Bowel sounds are normal. She exhibits no distension and no mass. There is no tenderness. There is no rebound and no guarding.  Musculoskeletal:  Poor rom L hip  Favors R leg when walking- has a difficult time   Lymphadenopathy:    She has no cervical adenopathy.  Neurological: She is alert. No cranial nerve deficit. She exhibits normal muscle tone. Coordination normal.  Skin: Skin is warm and dry. No erythema. No pallor.  Psychiatric: She has a normal mood and affect.          Assessment & Plan:   Problem List Items Addressed This Visit      Cardiovascular and Mediastinum   Essential hypertension - Primary    Better bp on 2nd check  BP: 130/80   Continue benicar hct  Rev last labs  Enc healthy diet  Continue water aerobics       Relevant Medications   olmesartan-hydrochlorothiazide (BENICAR HCT) 40-25 MG tablet     Digestive   GERD    Worse lately- then inc her prevacid to bid was very helpful  She has upcoming f/u with Dr Tiffany Kocher  Disc importance of GERD diet and handout given  Also will avoid eating late  Px prevacid 30 for bid dosing      Relevant Medications   lansoprazole (PREVACID) 30 MG capsule     Musculoskeletal and Integument   Osteoarthritis of hip    Pt undergoing injections-and not improving  Not able to work or walk  Water exercise helps  Will get a cane  Px meloxicam 15 mg with food prn and f/u with ortho as planned Sounds like she will need a hip repl in the future       Relevant Medications   meloxicam (MOBIC) 15 MG tablet     Other   Hyperlipidemia    Disc goals for lipids and reasons to control them Rev labs with pt (last draw) Rev low sat fat diet in detail       Relevant Medications   olmesartan-hydrochlorothiazide  (BENICAR HCT) 40-25 MG tablet

## 2016-06-14 NOTE — Progress Notes (Signed)
Pre visit review using our clinic review tool, if applicable. No additional management support is needed unless otherwise documented below in the visit note. 

## 2016-06-14 NOTE — Patient Instructions (Signed)
Increase prevacid to twice daily  Avoid foods that bother you  Follow up with Dr Tiffany Kocher as planned   Try meloxicam as needed daily for hip pain - take with food , if it bothers your stomach- stop it Also get a cane   Blood pressure is stable   Take care of yourself

## 2016-06-15 NOTE — Assessment & Plan Note (Signed)
Worse lately- then inc her prevacid to bid was very helpful  She has upcoming f/u with Dr Tiffany Kocher  Disc importance of GERD diet and handout given  Also will avoid eating late  Px prevacid 30 for bid dosing

## 2016-06-15 NOTE — Assessment & Plan Note (Signed)
Disc goals for lipids and reasons to control them Rev labs with pt (last draw) Rev low sat fat diet in detail

## 2016-07-23 ENCOUNTER — Other Ambulatory Visit: Payer: Self-pay | Admitting: Family Medicine

## 2016-07-24 ENCOUNTER — Telehealth: Payer: Self-pay | Admitting: Family Medicine

## 2016-07-24 NOTE — Telephone Encounter (Signed)
done

## 2016-07-24 NOTE — Telephone Encounter (Signed)
Pt needs a copy of xray of left hip. She needs for an appt with specialist.  Daughter will be here Wednesday morning to pick up Thanks

## 2016-09-05 DIAGNOSIS — K210 Gastro-esophageal reflux disease with esophagitis: Secondary | ICD-10-CM | POA: Diagnosis not present

## 2016-09-05 DIAGNOSIS — D126 Benign neoplasm of colon, unspecified: Secondary | ICD-10-CM | POA: Diagnosis not present

## 2016-09-05 DIAGNOSIS — K648 Other hemorrhoids: Secondary | ICD-10-CM | POA: Diagnosis not present

## 2016-09-05 DIAGNOSIS — R1310 Dysphagia, unspecified: Secondary | ICD-10-CM | POA: Diagnosis not present

## 2016-09-05 DIAGNOSIS — K449 Diaphragmatic hernia without obstruction or gangrene: Secondary | ICD-10-CM | POA: Diagnosis not present

## 2016-09-05 DIAGNOSIS — Z86010 Personal history of colonic polyps: Secondary | ICD-10-CM | POA: Diagnosis not present

## 2016-09-05 DIAGNOSIS — K3189 Other diseases of stomach and duodenum: Secondary | ICD-10-CM | POA: Diagnosis not present

## 2016-12-12 ENCOUNTER — Other Ambulatory Visit: Payer: Self-pay | Admitting: Family Medicine

## 2016-12-12 NOTE — Telephone Encounter (Signed)
Pt changed to mail order pharmacy

## 2017-06-27 ENCOUNTER — Telehealth: Payer: Self-pay | Admitting: Family Medicine

## 2017-06-27 MED ORDER — OLMESARTAN MEDOXOMIL-HCTZ 40-25 MG PO TABS: 1.0000 | ORAL_TABLET | Freq: Every day | ORAL | 0 refills | Status: DC

## 2017-06-27 MED ORDER — LANSOPRAZOLE 30 MG PO CPDR: 30.0000 mg | DELAYED_RELEASE_CAPSULE | Freq: Two times a day (BID) | ORAL | 0 refills | Status: DC

## 2017-06-27 NOTE — Telephone Encounter (Signed)
Patient has appointment 10/02/17 for annual exam- can she get refills of medications through that appointment?

## 2017-06-27 NOTE — Telephone Encounter (Signed)
Copied from [place] [phone]. Topic: Quick Communication - See Telephone Encounter >> Jun 27, 2017  8:11 AM Ether Griffins B wrote: CRM for notification. See Telephone encounter for:  Pt needing refill for lansoprazole and olmesartan - hydrochlorothiazide  06/27/17.

## 2017-06-27 NOTE — Telephone Encounter (Signed)
Med refilled.

## 2017-08-19 ENCOUNTER — Telehealth: Payer: Self-pay | Admitting: Family Medicine

## 2017-08-19 DIAGNOSIS — E7800 Pure hypercholesterolemia, unspecified: Secondary | ICD-10-CM

## 2017-08-19 DIAGNOSIS — E0590 Thyrotoxicosis, unspecified without thyrotoxic crisis or storm: Secondary | ICD-10-CM

## 2017-08-19 DIAGNOSIS — I10 Essential (primary) hypertension: Secondary | ICD-10-CM

## 2017-08-19 NOTE — Telephone Encounter (Signed)
-----   Message from Ellamae Sia sent at 08/15/2017  8:45 AM EST ----- Regarding: Lab orders for Wednesday, 2.6.19 Patient is scheduled for CPX labs, please order future labs, Thanks , Karna Christmas

## 2017-09-06 ENCOUNTER — Other Ambulatory Visit (INDEPENDENT_AMBULATORY_CARE_PROVIDER_SITE_OTHER): Payer: Medicare Other

## 2017-09-06 ENCOUNTER — Telehealth: Payer: Self-pay | Admitting: Family Medicine

## 2017-09-06 DIAGNOSIS — I10 Essential (primary) hypertension: Secondary | ICD-10-CM

## 2017-09-06 DIAGNOSIS — E7800 Pure hypercholesterolemia, unspecified: Secondary | ICD-10-CM

## 2017-09-06 LAB — COMPREHENSIVE METABOLIC PANEL
ALBUMIN: 4.2 g/dL (ref 3.5–5.2)
ALK PHOS: 60 U/L (ref 39–117)
ALT: 15 U/L (ref 0–35)
AST: 17 U/L (ref 0–37)
BILIRUBIN TOTAL: 0.6 mg/dL (ref 0.2–1.2)
BUN: 13 mg/dL (ref 6–23)
CALCIUM: 9.6 mg/dL (ref 8.4–10.5)
CO2: 32 meq/L (ref 19–32)
Chloride: 102 mEq/L (ref 96–112)
Creatinine, Ser: 0.78 mg/dL (ref 0.40–1.20)
GFR: 78.43 mL/min (ref >60.00)
Glucose, Bld: 103 mg/dL — ABNORMAL HIGH (ref 70–99)
Potassium: 4 mEq/L (ref 3.5–5.1)
Sodium: 138 mEq/L (ref 135–145)
TOTAL PROTEIN: 6.6 g/dL (ref 6.0–8.3)

## 2017-09-06 LAB — LIPID PANEL
CHOL/HDL RATIO: 3
Cholesterol: 236 mg/dL — ABNORMAL HIGH (ref 0–200)
HDL: 69.6 mg/dL (ref >39.00)
LDL Cholesterol: 127 mg/dL — ABNORMAL HIGH (ref 0–99)
NonHDL: 166.79
TRIGLYCERIDES: 197 mg/dL — AB (ref 0.0–149.0)
VLDL: 39.4 mg/dL (ref 0.0–40.0)

## 2017-09-06 LAB — CBC WITH DIFFERENTIAL/PLATELET
Basophils Absolute: 0.1 10*3/uL (ref 0.0–0.1)
Basophils Relative: 2.4 % (ref 0.0–3.0)
EOS ABS: 0.2 10*3/uL (ref 0.0–0.7)
Eosinophils Relative: 3 % (ref 0.0–5.0)
HEMATOCRIT: 44.3 % (ref 36.0–46.0)
Hemoglobin: 14.8 g/dL (ref 12.0–15.0)
LYMPHS PCT: 33.4 % (ref 12.0–46.0)
Lymphs Abs: 1.8 10*3/uL (ref 0.7–4.0)
MCHC: 33.3 g/dL (ref 30.0–36.0)
MCV: 93.9 fl (ref 78.0–100.0)
Monocytes Absolute: 0.4 10*3/uL (ref 0.1–1.0)
Monocytes Relative: 7.3 % (ref 3.0–12.0)
NEUTROS ABS: 2.9 10*3/uL (ref 1.4–7.7)
Neutrophils Relative %: 53.9 % (ref 43.0–77.0)
PLATELETS: 281 10*3/uL (ref 150.0–400.0)
RBC: 4.72 Mil/uL (ref 3.87–5.11)
RDW: 13.4 % (ref 11.5–15.5)
WBC: 5.3 10*3/uL (ref 4.0–10.5)

## 2017-09-06 LAB — TSH: TSH: 2.27 u[IU]/mL (ref 0.35–4.50)

## 2017-09-06 NOTE — Telephone Encounter (Signed)
Tdap updated

## 2017-09-06 NOTE — Telephone Encounter (Signed)
Pt dropped off vaccination record. Placed in Rx tower.

## 2017-09-12 ENCOUNTER — Encounter: Payer: Federal, State, Local not specified - PPO | Admitting: Family Medicine

## 2017-09-18 ENCOUNTER — Other Ambulatory Visit: Payer: Federal, State, Local not specified - PPO

## 2017-09-19 ENCOUNTER — Other Ambulatory Visit: Payer: Federal, State, Local not specified - PPO

## 2017-09-19 ENCOUNTER — Encounter: Payer: Self-pay | Admitting: Family Medicine

## 2017-09-19 ENCOUNTER — Ambulatory Visit (INDEPENDENT_AMBULATORY_CARE_PROVIDER_SITE_OTHER): Payer: Medicare Other | Admitting: Family Medicine

## 2017-09-19 VITALS — BP 122/64 | HR 74 | Temp 98.1°F | Ht 66.5 in | Wt 164.5 lb

## 2017-09-19 DIAGNOSIS — E0590 Thyrotoxicosis, unspecified without thyrotoxic crisis or storm: Secondary | ICD-10-CM | POA: Diagnosis not present

## 2017-09-19 DIAGNOSIS — Z0000 Encounter for general adult medical examination without abnormal findings: Secondary | ICD-10-CM | POA: Diagnosis not present

## 2017-09-19 DIAGNOSIS — E7800 Pure hypercholesterolemia, unspecified: Secondary | ICD-10-CM | POA: Diagnosis not present

## 2017-09-19 DIAGNOSIS — E2839 Other primary ovarian failure: Secondary | ICD-10-CM | POA: Diagnosis not present

## 2017-09-19 DIAGNOSIS — Z23 Encounter for immunization: Secondary | ICD-10-CM

## 2017-09-19 DIAGNOSIS — R7309 Other abnormal glucose: Secondary | ICD-10-CM | POA: Diagnosis not present

## 2017-09-19 DIAGNOSIS — I10 Essential (primary) hypertension: Secondary | ICD-10-CM

## 2017-09-19 MED ORDER — LANSOPRAZOLE 30 MG PO CPDR: 30.0000 mg | DELAYED_RELEASE_CAPSULE | Freq: Two times a day (BID) | ORAL | 3 refills | Status: DC

## 2017-09-19 MED ORDER — OLMESARTAN MEDOXOMIL-HCTZ 40-25 MG PO TABS: 1.0000 | ORAL_TABLET | Freq: Every day | ORAL | 3 refills | Status: DC

## 2017-09-19 NOTE — Patient Instructions (Addendum)
Don't forget to schedule your mammogram   prevnar vaccine today   If you are interested in the new shingles vaccine (Shingrix) - call your local pharmacy to check on coverage and availability     we will refer you for a bone density test -our office will call you   Please look at the paperwork re: advance directive   For cholesterol  Avoid red meat/ fried foods/ egg yolks/ fatty breakfast meats/ butter, cheese and high fat dairy/ and shellfish    Try to get 1200-1500 mg of calcium per day with at least 1000 iu of vitamin D - for bone health

## 2017-09-19 NOTE — Progress Notes (Signed)
Subjective:    Patient ID: Caroline Francis, female    DOB: May 16, 1951, 67 y.o.   MRN: [id number]  HPI  Here for welcome to medicare visit  I have personally reviewed the Medicare Annual Wellness questionnaire and have noted 1. The patient's medical and social history 2. Their use of alcohol, tobacco or illicit drugs 3. Their current medications and supplements 4. The patient's functional ability including ADL's, fall risks, home safety risks and hearing or visual             impairment. 5. Diet and physical activities 6. Evidence for depression or mood disorders  The patients weight, height, BMI have been recorded in the chart and visual acuity is per eye clinic.  I have made referrals, counseling and provided education to the patient based review of the above and I have provided the pt with a written personalized care plan for preventive services. Reviewed and updated provider list, see scanned forms.  Doing well lately  Volunteers at the hospital  Enjoys retirement    [place] Readings from Last 3 Encounters:  09/19/17 164 lb 8 oz (74.6 kg)  06/14/16 165 lb 12 oz (75.2 kg)  07/23/15 168 lb 12 oz (76.5 kg)  she is keeping busy  Eating healthy  Some exercise -water classes 4 d per week and silver sneakers 1 day  26.15 kg/m    See scanned forms.  Routine anticipatory guidance given to patient.  See health maintenance. Colon cancer screening- colonoscopy 2 y ago-Dr Tiffany Kocher , normal (5 y recall)  Breast cancer screening- needs to do a mammogram / [place]  Self breast exam-no lumps  Has had a hysterectomy  Flu vaccine 1/19 Tetanus vaccine Tdap 1/19  Hep C screening -declines/ low risk  Pneumovax-will take prevnar today  Zoster vaccine (had zostavax but interested in shingrix) Bone density screening - is interested  Advance directive-will work on that-given hand out  Cognitive function addressed- see scanned forms- and if abnormal then additional documentation follows. -no  problems with memory  No issues at all   PMH and SH reviewed  Meds, vitals, and allergies reviewed.   ROS: See HPI.  Otherwise negative.     Hearing Screening   125Hz  250Hz  500Hz  1000Hz  2000Hz  3000Hz  4000Hz  6000Hz  8000Hz   Right ear:   40 40 40  40    Left ear:   40 40 40  40      Visual Acuity Screening   Right eye Left eye Both eyes  Without correction:     With correction: 20/25 20/25 20/25     bp is stable today  No cp or palpitations or headaches or edema  No side effects to medicines  BP Readings from Last 3 Encounters:  09/19/17 122/64  06/14/16 130/80  07/23/15 128/85       Hyperthyroidism Lab Results  Component Value Date   TSH 2.27 09/06/2017    No longer seeing endocrinology    Hyperlipidemia Lab Results  Component Value Date   CHOL 236 (H) 09/06/2017   CHOL 259 (H) 07/23/2015   CHOL 243 (H) 04/15/2014   Lab Results  Component Value Date   HDL 69.60 09/06/2017   HDL 71 07/23/2015   HDL 57.70 04/15/2014   Lab Results  Component Value Date   LDLCALC 127 (H) 09/06/2017   LDLCALC 127 07/23/2015   Lab Results  Component Value Date   TRIG 197.0 (H) 09/06/2017   TRIG 307 (H) 07/23/2015   TRIG 255.0 (H) 04/15/2014  Lab Results  Component Value Date   CHOLHDL 3 09/06/2017   CHOLHDL 3.6 07/23/2015   CHOLHDL 4 04/15/2014   Lab Results  Component Value Date   LDLDIRECT 144.2 04/15/2014   LDLDIRECT 144.7 04/03/2012   LDLDIRECT 163.8 03/30/2010    Stable to improved  She stays away from fatty food   Sugar 103  Not a big sweet eater  occ sweet tea   Lab Results  Component Value Date   CREATININE 0.78 09/06/2017   BUN 13 09/06/2017   NA 138 09/06/2017   K 4.0 09/06/2017   CL 102 09/06/2017   CO2 32 09/06/2017   Lab Results  Component Value Date   ALT 15 09/06/2017   AST 17 09/06/2017   ALKPHOS 60 09/06/2017   BILITOT 0.6 09/06/2017    Lab Results  Component Value Date   WBC 5.3 09/06/2017   HGB 14.8 09/06/2017   HCT 44.3  09/06/2017   MCV 93.9 09/06/2017   PLT 281.0 09/06/2017    Patient Active Problem List   Diagnosis Date Noted  . Welcome to Medicare preventive visit 09/19/2017  . Estrogen deficiency 09/19/2017  . Elevated glucose level 09/19/2017  . Osteoarthritis of hip 07/26/2015  . Pain in left hip 07/23/2015  . External hemorrhoids 04/09/2012  . Routine general medical examination at a health care facility 04/02/2012  . SNORING, HX OF 05/04/2010  . Hyperthyroidism 03/31/2010  . Hyperlipidemia 12/25/2007  . Essential hypertension 12/25/2007  . GERD 12/25/2007  . HIATAL HERNIA 12/25/2007   Past Medical History:  Diagnosis Date  . GERD (gastroesophageal reflux disease)   . Hyperlipidemia   . Hypertension   . Low TSH level    Past Surgical History:  Procedure Laterality Date  . ABDOMINAL HYSTERECTOMY     partial /bleeding  . NASAL SEPTUM SURGERY     Social History   Tobacco Use  . Smoking status: Never Smoker  . Smokeless tobacco: Never Used  Substance Use Topics  . Alcohol use: Yes    Comment: occasional glass of wine  . Drug use: No   History reviewed. No pertinent family history. Allergies  Allergen Reactions  . Lisinopril-Hydrochlorothiazide Cough    REACTION: side effect 03/30/10 Pt said not allergic and no adverse reactions to any meds.   No current outpatient medications on file prior to visit.   No current facility-administered medications on file prior to visit.     Review of Systems  Constitutional: Negative for activity change, appetite change, fatigue, fever and unexpected weight change.  HENT: Negative for congestion, ear pain, rhinorrhea, sinus pressure and sore throat.   Eyes: Negative for pain, redness and visual disturbance.  Respiratory: Negative for cough, shortness of breath and wheezing.   Cardiovascular: Negative for chest pain and palpitations.  Gastrointestinal: Negative for abdominal pain, blood in stool, constipation and diarrhea.  Endocrine:  Negative for polydipsia and polyuria.  Genitourinary: Negative for dysuria, frequency and urgency.  Musculoskeletal: Negative for arthralgias, back pain and myalgias.  Skin: Negative for pallor and rash.  Allergic/Immunologic: Negative for environmental allergies.  Neurological: Negative for dizziness, syncope and headaches.  Hematological: Negative for adenopathy. Does not bruise/bleed easily.  Psychiatric/Behavioral: Negative for decreased concentration and dysphoric mood. The patient is not nervous/anxious.        Objective:   Physical Exam  Constitutional: She appears well-developed and well-nourished. No distress.  Well appearing   HENT:  Head: Normocephalic and atraumatic.  Right Ear: External ear normal.  Left Ear: External  ear normal.  Mouth/Throat: Oropharynx is clear and moist.  Eyes: Conjunctivae and EOM are normal. Pupils are equal, round, and reactive to light. No scleral icterus.  Neck: Normal range of motion. Neck supple. No JVD present. Carotid bruit is not present. No thyromegaly present.  Cardiovascular: Normal rate, regular rhythm, normal heart sounds and intact distal pulses. Exam reveals no gallop.  Pulmonary/Chest: Effort normal and breath sounds normal. No respiratory distress. She has no wheezes. She exhibits no tenderness.  Abdominal: Soft. Bowel sounds are normal. She exhibits no distension, no abdominal bruit and no mass. There is no tenderness.  Genitourinary: No breast swelling, tenderness, discharge or bleeding.  Genitourinary Comments: Breast exam: No mass, nodules, thickening, tenderness, bulging, retraction, inflamation, nipple discharge or skin changes noted.  No axillary or clavicular LA.      Musculoskeletal: Normal range of motion. She exhibits no edema or tenderness.  No kyphosis   Lymphadenopathy:    She has no cervical adenopathy.  Neurological: She is alert. She has normal reflexes. No cranial nerve deficit. She exhibits normal muscle tone.  Coordination normal.  Skin: Skin is warm and dry. No rash noted. No erythema. No pallor.  Solar lentigines diffusely   Psychiatric: She has a normal mood and affect.          Assessment & Plan:   Problem List Items Addressed This Visit      Cardiovascular and Mediastinum   Essential hypertension    bp in fair control at this time  BP Readings from Last 1 Encounters:  09/19/17 122/64   No changes needed Disc lifstyle change with low sodium diet and exercise        Relevant Medications   olmesartan-hydrochlorothiazide (BENICAR HCT) 40-25 MG tablet     Endocrine   Hyperthyroidism    No longer seeing endocrinology - no problems  Lab Results  Component Value Date   TSH 2.27 09/06/2017           Other   Elevated glucose level    103 fasting  disc imp of low glycemic diet and wt loss to prevent DM2  Will get A1C next time       Estrogen deficiency    Ref for dexa       Relevant Orders   DG Bone Density   Hyperlipidemia    Disc goals for lipids and reasons to control them Rev labs with pt Rev low sat fat diet in detail  Triglycerides are improved Good HDL      Relevant Medications   olmesartan-hydrochlorothiazide (BENICAR HCT) 40-25 MG tablet   Welcome to Medicare preventive visit - Primary    Reviewed health habits including diet and exercise and skin cancer prevention Reviewed appropriate screening tests for age  Also reviewed health mt list, fam hx and immunization status , as well as social and family history   See HPI Labs reviewed  Avs/plan Don't forget to schedule your mammogram   prevnar vaccine today   If you are interested in the new shingles vaccine (Shingrix) - call your local pharmacy to check on coverage and availability     we will refer you for a bone density test -our office will call you   Please look at the paperwork re: advance directive   For cholesterol  Avoid red meat/ fried foods/ egg yolks/ fatty breakfast meats/ butter,  cheese and high fat dairy/ and shellfish    Try to get 1200-1500 mg of calcium per day with at least  1000 iu of vitamin D - for bone health         Other Visit Diagnoses    Need for prophylactic vaccination against Streptococcus pneumoniae (pneumococcus)       Relevant Orders   Pneumococcal conjugate vaccine 13-valent (Completed)

## 2017-09-20 ENCOUNTER — Telehealth: Payer: Self-pay | Admitting: *Deleted

## 2017-09-20 NOTE — Assessment & Plan Note (Signed)
No longer seeing endocrinology - no problems  Lab Results  Component Value Date   TSH 2.27 09/06/2017

## 2017-09-20 NOTE — Assessment & Plan Note (Signed)
103 fasting  disc imp of low glycemic diet and wt loss to prevent DM2  Will get A1C next time

## 2017-09-20 NOTE — Telephone Encounter (Signed)
Received fax from [facility] saying that pt's benicar HCT is on back order. Also the 20/12.5mg  is also on back order and they are requesting an alt med. They did note on the fax that all strengths of losartan/hctz are available, please advise

## 2017-09-20 NOTE — Assessment & Plan Note (Signed)
bp in fair control at this time  BP Readings from Last 1 Encounters:  09/19/17 122/64   No changes needed Disc lifstyle change with low sodium diet and exercise

## 2017-09-20 NOTE — Assessment & Plan Note (Signed)
Reviewed health habits including diet and exercise and skin cancer prevention Reviewed appropriate screening tests for age  Also reviewed health mt list, fam hx and immunization status , as well as social and family history   See HPI Labs reviewed  Avs/plan Don't forget to schedule your mammogram   prevnar vaccine today   If you are interested in the new shingles vaccine (Shingrix) - call your local pharmacy to check on coverage and availability     we will refer you for a bone density test -our office will call you   Please look at the paperwork re: advance directive   For cholesterol  Avoid red meat/ fried foods/ egg yolks/ fatty breakfast meats/ butter, cheese and high fat dairy/ and shellfish    Try to get 1200-1500 mg of calcium per day with at least 1000 iu of vitamin D - for bone health

## 2017-09-20 NOTE — Assessment & Plan Note (Signed)
Ref for dexa 

## 2017-09-20 NOTE — Assessment & Plan Note (Signed)
Disc goals for lipids and reasons to control them Rev labs with pt Rev low sat fat diet in detail  Triglycerides are improved Good HDL

## 2017-09-21 DIAGNOSIS — Z23 Encounter for immunization: Secondary | ICD-10-CM | POA: Diagnosis not present

## 2017-09-21 MED ORDER — LOSARTAN POTASSIUM-HCTZ 100-25 MG PO TABS: 1.0000 | ORAL_TABLET | Freq: Every day | ORAL | 3 refills | Status: DC

## 2017-09-21 NOTE — Telephone Encounter (Signed)
I am sending losartan hct instead 100-25 Let me know if any problems  Let pt know we have to switch  Thanks

## 2017-09-21 NOTE — Telephone Encounter (Signed)
Only thing on the fax that they said they had was all strengths of losartan/hctz

## 2017-09-21 NOTE — Telephone Encounter (Signed)
Pt has enough benicar for now [facility] and tell [facility] not to [facility] alt med and before she needs a refill she will call them back to see if it's back in stock if so she will request med

## 2017-09-21 NOTE — Telephone Encounter (Signed)
I do not have the fax - what do they have available?

## 2017-09-26 ENCOUNTER — Encounter: Payer: Federal, State, Local not specified - PPO | Admitting: Family Medicine

## 2017-10-30 ENCOUNTER — Ambulatory Visit
Admission: RE | Admit: 2017-10-30 | Discharge: 2017-10-30 | Disposition: A | Discharge status: Home / Self Care | Payer: Medicare Other | Source: Ambulatory Visit | Attending: Family Medicine | Admitting: Family Medicine

## 2017-10-30 DIAGNOSIS — M8589 Other specified disorders of bone density and structure, multiple sites: Secondary | ICD-10-CM | POA: Diagnosis not present

## 2017-10-30 DIAGNOSIS — Z780 Asymptomatic menopausal state: Secondary | ICD-10-CM | POA: Diagnosis not present

## 2017-10-30 DIAGNOSIS — M8580 Other specified disorders of bone density and structure, unspecified site: Secondary | ICD-10-CM | POA: Diagnosis not present

## 2017-10-30 DIAGNOSIS — E2839 Other primary ovarian failure: Secondary | ICD-10-CM | POA: Insufficient documentation

## 2017-10-30 DIAGNOSIS — Z13820 Encounter for screening for osteoporosis: Secondary | ICD-10-CM | POA: Insufficient documentation

## 2017-11-04 ENCOUNTER — Encounter: Payer: Self-pay | Admitting: Family Medicine

## 2017-11-04 DIAGNOSIS — M8580 Other specified disorders of bone density and structure, unspecified site: Secondary | ICD-10-CM | POA: Insufficient documentation

## 2017-11-05 ENCOUNTER — Encounter: Payer: Self-pay | Admitting: *Deleted

## 2017-12-28 DIAGNOSIS — Z23 Encounter for immunization: Secondary | ICD-10-CM | POA: Diagnosis not present

## 2018-05-15 ENCOUNTER — Telehealth: Payer: Self-pay | Admitting: *Deleted

## 2018-05-15 NOTE — Telephone Encounter (Signed)
Called [facility] because I received a fax saying that Benicar-HCTZ as well as pt's Hyzaar is on back order. I called pharmacy to request an alt med and they said they don't have any alt meds in stock they can recommend. All strengths of losartan and all strengths of alt meds like Benicar are on back order the only thing they do have in stock is HCTZ (by it's self)

## 2018-05-16 NOTE — Telephone Encounter (Signed)
Some of the local pharmacies have medications that the mail orders don't  I want her to stick with an angiotensin receptor blocker  Please call or have her call her closest local pharmacy to see if they have any ARBs available  If not we will need to change her medicine thanks

## 2018-05-17 ENCOUNTER — Telehealth: Payer: Self-pay | Admitting: *Deleted

## 2018-05-17 NOTE — Telephone Encounter (Signed)
Addressed on prev phone note

## 2018-05-17 NOTE — Telephone Encounter (Signed)
See previous phone note- I would like to see if a local pharmacy near her has any ARB medications (before I change it to something different)  Thanks

## 2018-05-17 NOTE — Telephone Encounter (Signed)
Pt said she has over 3 weeks left of meds so she is going to give the mail order a few weeks to see if they get any in stock and if she she will get it from them if not she will start talking to local pharmacies to see if they have any in stock pt said she's been doing really well on this med and she doesn't want to change it if she doesn't have to so she will keep [place] posted

## 2018-05-17 NOTE — Telephone Encounter (Signed)
Pt left message on triage vm and states that her hyzaar has been d/c and is wanting to know if she could have an alternate med. Rx can be sent to [facility]

## 2018-06-03 ENCOUNTER — Telehealth: Payer: Self-pay | Admitting: *Deleted

## 2018-06-03 MED ORDER — OLMESARTAN MEDOXOMIL 40 MG PO TABS: 40.0000 mg | ORAL_TABLET | Freq: Every day | ORAL | 1 refills | Status: DC

## 2018-06-03 MED ORDER — HYDROCHLOROTHIAZIDE 25 MG PO TABS: 25.0000 mg | ORAL_TABLET | Freq: Every day | ORAL | 1 refills | Status: DC

## 2018-06-03 NOTE — Telephone Encounter (Signed)
Please send separate px for benicar and hct  Thanks

## 2018-06-03 NOTE — Telephone Encounter (Signed)
Rxs sent

## 2018-06-03 NOTE — Telephone Encounter (Signed)
Patient called stating that she is on Benicar 40/25 which does not match the medication list. Patient stated that [facility] does not have the combination medication and needs a script sent in for the two different medications. . Patient requested a 90 day supply on each sent to [facility]. Patient stated that she has enough to last her until she gets it from the mail order pharmacy.

## 2018-09-21 ENCOUNTER — Other Ambulatory Visit: Payer: Self-pay | Admitting: Family Medicine

## 2018-09-23 NOTE — Telephone Encounter (Signed)
Please schedule f/u or PE and refill until then  Thanks  

## 2018-09-23 NOTE — Telephone Encounter (Signed)
No recent or future appts., please advise  

## 2018-09-24 NOTE — Telephone Encounter (Signed)
Med filled once and Carrie will reach out to pt to try and get appt scheduled  

## 2018-11-05 ENCOUNTER — Ambulatory Visit: Payer: Medicare Other

## 2018-11-11 ENCOUNTER — Encounter: Payer: Medicare Other | Admitting: Family Medicine

## 2018-12-02 ENCOUNTER — Telehealth: Payer: Self-pay | Admitting: Family Medicine

## 2018-12-02 NOTE — Telephone Encounter (Signed)
Pt's CPE and AWV were cancelled due to Covid, no recent or future appts., please advise

## 2018-12-02 NOTE — Telephone Encounter (Signed)
Please re schedule those and refill until then

## 2018-12-03 NOTE — Telephone Encounter (Signed)
Pt declined to make appts due to Covid-19, will refill once since she is overdue for CPE/AWV

## 2018-12-04 NOTE — Telephone Encounter (Signed)
Patient has been scheduled in Aug for Physical and then Scheduled with Caroline Francis in hernext Available slot.

## 2019-02-24 ENCOUNTER — Other Ambulatory Visit: Payer: Self-pay | Admitting: *Deleted

## 2019-02-24 MED ORDER — LANSOPRAZOLE 30 MG PO CPDR: 30.0000 mg | DELAYED_RELEASE_CAPSULE | Freq: Two times a day (BID) | ORAL | 1 refills | Status: DC

## 2019-02-24 MED ORDER — OLMESARTAN MEDOXOMIL 40 MG PO TABS: 40.0000 mg | ORAL_TABLET | Freq: Every day | ORAL | 1 refills | Status: DC

## 2019-02-24 MED ORDER — HYDROCHLOROTHIAZIDE 25 MG PO TABS: 25.0000 mg | ORAL_TABLET | Freq: Every day | ORAL | 1 refills | Status: DC

## 2019-02-24 NOTE — Telephone Encounter (Signed)
Patient left a voicemail stating that she got notice from [facility] that she is out of refills. Patient has an appointment scheduled 03/03/19

## 2019-02-24 NOTE — Telephone Encounter (Signed)
Med refilled.

## 2019-03-03 ENCOUNTER — Ambulatory Visit (INDEPENDENT_AMBULATORY_CARE_PROVIDER_SITE_OTHER): Payer: Medicare Other | Admitting: Family Medicine

## 2019-03-03 ENCOUNTER — Encounter: Payer: Self-pay | Admitting: Family Medicine

## 2019-03-03 ENCOUNTER — Other Ambulatory Visit: Payer: Self-pay

## 2019-03-03 VITALS — BP 132/78 | HR 62 | Temp 98.2°F | Ht 66.0 in | Wt 164.2 lb

## 2019-03-03 DIAGNOSIS — Z23 Encounter for immunization: Secondary | ICD-10-CM

## 2019-03-03 DIAGNOSIS — Z0000 Encounter for general adult medical examination without abnormal findings: Secondary | ICD-10-CM

## 2019-03-03 DIAGNOSIS — K644 Residual hemorrhoidal skin tags: Secondary | ICD-10-CM

## 2019-03-03 DIAGNOSIS — E0590 Thyrotoxicosis, unspecified without thyrotoxic crisis or storm: Secondary | ICD-10-CM

## 2019-03-03 DIAGNOSIS — E7800 Pure hypercholesterolemia, unspecified: Secondary | ICD-10-CM | POA: Diagnosis not present

## 2019-03-03 DIAGNOSIS — I10 Essential (primary) hypertension: Secondary | ICD-10-CM | POA: Diagnosis not present

## 2019-03-03 DIAGNOSIS — R7309 Other abnormal glucose: Secondary | ICD-10-CM

## 2019-03-03 DIAGNOSIS — M8580 Other specified disorders of bone density and structure, unspecified site: Secondary | ICD-10-CM

## 2019-03-03 NOTE — Assessment & Plan Note (Signed)
Disc goals for lipids and reasons to control them Rev last labs with pt Rev low sat fat diet in detail Labs today  Diet is fairly good

## 2019-03-03 NOTE — Assessment & Plan Note (Signed)
Reviewed health habits including diet and exercise and skin cancer prevention Reviewed appropriate screening tests for age  Also reviewed health mt list, fam hx and immunization status , as well as social and family history   See [facility] Labs ordered Declines mammograms -exam done and enc self breast exams Enc flu vaccine in the fall  PNA 23 vaccine given today  Enc to start ca and D for bone health and exercise  She is working on advance directive and almost done  No cognitive concerns  Reassuring hearing /vision exams

## 2019-03-03 NOTE — Assessment & Plan Note (Signed)
Mild-rev dexa 4/19 Enc her to start D and ca Also continue exercise  No falls or fx

## 2019-03-03 NOTE — Assessment & Plan Note (Signed)
bp in fair control at this time  BP Readings from Last 1 Encounters:  03/03/19 132/78   No changes needed Most recent labs reviewed  Disc lifstyle change with low sodium diet and exercise

## 2019-03-03 NOTE — Assessment & Plan Note (Signed)
Labs today  disc imp of low glycemic diet and wt loss to prevent DM2

## 2019-03-03 NOTE — Assessment & Plan Note (Signed)
Due for TSH No clinical changes RI tx in the past Lab today

## 2019-03-03 NOTE — Patient Instructions (Addendum)
Get a flu shot in the fall - sept or oct   Pneumonia vaccine today (23)   Try to get 1200-1500 mg of calcium per day with at least 1000 iu of vitamin D - for bone health   Keep working on living will and power of attorney  Get [place] a copy and we can scan it into your chart

## 2019-03-03 NOTE — Progress Notes (Signed)
Subjective:    Patient ID: Caroline Francis, female    DOB: 22-Jan-1951, 68 y.o.   MRN: [id number]  HPI  Here for amw/health mt exam and rev of chronic medical problems   I have personally reviewed the Medicare Annual Wellness questionnaire and have noted 1. The patient's medical and social history 2. Their use of alcohol, tobacco or illicit drugs 3. Their current medications and supplements 4. The patient's functional ability including ADL's, fall risks, home safety risks and hearing or visual             impairment. 5. Diet and physical activities 6. Evidence for depression or mood disorders  The patients weight, height, BMI have been recorded in the chart and visual acuity is per eye clinic.  I have made referrals, counseling and provided education to the patient based review of the above and I have provided the pt with a written personalized care plan for preventive services. Reviewed and updated provider list, see scanned forms.  See scanned forms.  Routine anticipatory guidance given to patient.  See health maintenance. Colon cancer screening  Colonoscopy 2/18 Breast cancer screening mammogram 10/13- does not want one /may in the future  Self breast exam- no lumps or changes  Flu vaccine-in the fall  Tetanus vaccine   Tdap 1/19  Pneumovax- due for ppv23 vaccine  Zoster vaccine-had shingrix  Dexa 4/19 -mild osteopenia  Falls-none  Fractures-none Supplements -does not take D or ca  Exercise - 5 days per week - aquatic center (by appointment) -she loves it   Advance directive-she is working on living will and poa  Cognitive function addressed- see scanned forms- and if abnormal then additional documentation follows.  No memory concerns  Does own finances No confusion /not getting lost    PMH and SH reviewed  Meds, vitals, and allergies reviewed.   ROS: See HPI.  Otherwise negative.    Weight : Wt Readings from Last 3 Encounters:  03/03/19 164 lb 4 oz (74.5 kg)   09/19/17 164 lb 8 oz (74.6 kg)  06/14/16 165 lb 12 oz (75.2 kg)  good weight  Exercising  Eats healthy -does some takeout  26.51 kg/m   Hearing/vision:  Hearing Screening   125Hz  250Hz  500Hz  1000Hz  2000Hz  3000Hz  4000Hz  6000Hz  8000Hz   Right ear:   40 40 40  40    Left ear:   40 40 40  40      Visual Acuity Screening   Right eye Left eye Both eyes  Without correction:     With correction: 20/25 20/25 20/20     bp is stable today  No cp or palpitations or headaches or edema  No side effects to medicines  BP Readings from Last 3 Encounters:  03/03/19 132/78  09/19/17 122/64  06/14/16 130/80      H/o hyperthyroidism No clinical changes at all  Lab Results  Component Value Date   TSH 2.27 09/06/2017    Due for labs  Used to see endocrine  Hyperlipidemia Lab Results  Component Value Date   CHOL 236 (H) 09/06/2017   HDL 69.60 09/06/2017   LDLCALC 127 (H) 09/06/2017   LDLDIRECT 144.2 04/15/2014   TRIG 197.0 (H) 09/06/2017   CHOLHDL 3 09/06/2017   Due for labs   Patient Active Problem List   Diagnosis Date Noted  . Medicare annual wellness visit, initial 03/03/2019  . Osteopenia 11/04/2017  . Welcome to Medicare preventive visit 09/19/2017  . Estrogen deficiency 09/19/2017  .  Elevated glucose level 09/19/2017  . Osteoarthritis of hip 07/26/2015  . Pain in left hip 07/23/2015  . External hemorrhoids 04/09/2012  . Routine general medical examination at a health care facility 04/02/2012  . SNORING, HX OF 05/04/2010  . Hyperthyroidism 03/31/2010  . Hyperlipidemia 12/25/2007  . Essential hypertension 12/25/2007  . GERD 12/25/2007  . HIATAL HERNIA 12/25/2007   Past Medical History:  Diagnosis Date  . GERD (gastroesophageal reflux disease)   . Hyperlipidemia   . Hypertension   . Low TSH level    Past Surgical History:  Procedure Laterality Date  . ABDOMINAL HYSTERECTOMY     partial /bleeding  . NASAL SEPTUM SURGERY     Social History   Tobacco Use  .  Smoking status: Never Smoker  . Smokeless tobacco: Never Used  Substance Use Topics  . Alcohol use: Yes    Comment: occasional glass of wine  . Drug use: No   No family history on file. Allergies  Allergen Reactions  . Lisinopril-Hydrochlorothiazide Cough    REACTION: side effect 03/30/10 Pt said not allergic and no adverse reactions to any meds.   Current Outpatient Medications on File Prior to Visit  Medication Sig Dispense Refill  . hydrochlorothiazide (HYDRODIURIL) 25 MG tablet Take 1 tablet (25 mg total) by mouth daily. 90 tablet 1  . lansoprazole (PREVACID) 30 MG capsule Take 1 capsule (30 mg total) by mouth 2 (two) times daily. 180 capsule 1  . olmesartan (BENICAR) 40 MG tablet Take 1 tablet (40 mg total) by mouth daily. 90 tablet 1   No current facility-administered medications on file prior to visit.      Review of Systems  Constitutional: Negative for activity change, appetite change, fatigue, fever and unexpected weight change.  HENT: Negative for congestion, ear pain, rhinorrhea, sinus pressure and sore throat.   Eyes: Negative for pain, redness and visual disturbance.  Respiratory: Negative for cough, shortness of breath and wheezing.   Cardiovascular: Negative for chest pain and palpitations.  Gastrointestinal: Negative for abdominal pain, blood in stool, constipation and diarrhea.  Endocrine: Negative for polydipsia and polyuria.  Genitourinary: Negative for dysuria, frequency and urgency.  Musculoskeletal: Negative for arthralgias, back pain and myalgias.  Skin: Negative for pallor and rash.  Allergic/Immunologic: Negative for environmental allergies.  Neurological: Negative for dizziness, syncope and headaches.  Hematological: Negative for adenopathy. Does not bruise/bleed easily.  Psychiatric/Behavioral: Negative for decreased concentration and dysphoric mood. The patient is not nervous/anxious.        Objective:   Physical Exam Constitutional:       General: She is not in acute distress.    Appearance: Normal appearance. She is well-developed. She is not ill-appearing or diaphoretic.  HENT:     Head: Normocephalic and atraumatic.     Right Ear: Tympanic membrane, ear canal and external ear normal.     Left Ear: Tympanic membrane, ear canal and external ear normal.     Mouth/Throat:     Mouth: Mucous membranes are moist.     Pharynx: Oropharynx is clear. No posterior oropharyngeal erythema.  Eyes:     General: No scleral icterus.    Conjunctiva/sclera: Conjunctivae normal.     Pupils: Pupils are equal, round, and reactive to light.  Neck:     Musculoskeletal: Normal range of motion and neck supple. No neck rigidity.     Thyroid: No thyromegaly.     Vascular: No carotid bruit or JVD.  Cardiovascular:     Rate and Rhythm:  Normal rate and regular rhythm.     Pulses: Normal pulses.     Heart sounds: Normal heart sounds. No gallop.   Pulmonary:     Effort: Pulmonary effort is normal. No respiratory distress.     Breath sounds: Normal breath sounds. No wheezing.  Chest:     Chest wall: No tenderness.  Abdominal:     General: Bowel sounds are normal. There is no distension or abdominal bruit.     Palpations: Abdomen is soft. There is no mass.     Tenderness: There is no abdominal tenderness.     Hernia: No hernia is present.  Genitourinary:    Comments: Breast exam: No mass, nodules, thickening, tenderness, bulging, retraction, inflamation, nipple discharge or skin changes noted.  No axillary or clavicular LA.     Musculoskeletal: Normal range of motion.        General: No tenderness.     Right lower leg: No edema.     Left lower leg: No edema.  Lymphadenopathy:     Cervical: No cervical adenopathy.  Skin:    General: Skin is warm and dry.     Coloration: Skin is not pale.     Findings: No erythema or rash.     Comments: Solar lentigines diffusely  Mildly tanned  Neurological:     Mental Status: She is alert.     Cranial  Nerves: No cranial nerve deficit.     Motor: No abnormal muscle tone.     Coordination: Coordination normal.     Gait: Gait normal.     Deep Tendon Reflexes: Reflexes are normal and symmetric. Reflexes normal.  Psychiatric:        Mood and Affect: Mood normal.        Cognition and Memory: Cognition and memory normal.     Comments: Pleasant and talkative            Assessment & Plan:   Problem List Items Addressed This Visit      Cardiovascular and Mediastinum   Essential hypertension    bp in fair control at this time  BP Readings from Last 1 Encounters:  03/03/19 132/78   No changes needed Most recent labs reviewed  Disc lifstyle change with low sodium diet and exercise        Relevant Orders   CBC with Differential/Platelet   Comprehensive metabolic panel   Lipid panel   TSH   External hemorrhoids     Endocrine   Hyperthyroidism    Due for TSH No clinical changes RI tx in the past Lab today      Relevant Orders   TSH     Musculoskeletal and Integument   Osteopenia    Mild-rev dexa 4/19 Enc her to start D and ca Also continue exercise  No falls or fx        Other   Hyperlipidemia    Disc goals for lipids and reasons to control them Rev last labs with pt Rev low sat fat diet in detail Labs today  Diet is fairly good      Relevant Orders   Comprehensive metabolic panel   Lipid panel   Routine general medical examination at a health care facility    Reviewed health habits including diet and exercise and skin cancer prevention Reviewed appropriate screening tests for age  Also reviewed health mt list, fam hx and immunization status , as well as social and family history   See [facility] Labs ordered  Declines mammograms -exam done and enc self breast exams Enc flu vaccine in the fall  PNA 23 vaccine given today  Enc to start ca and D for bone health and exercise  She is working on advance directive and almost done  No cognitive concerns   Reassuring hearing /vision exams          Relevant Orders   Pneumococcal polysaccharide vaccine 23-valent greater than or equal to 2yo subcutaneous/IM (Completed)   Elevated glucose level    Labs today  disc imp of low glycemic diet and wt loss to prevent DM2       Medicare annual wellness visit, initial - Primary    Reviewed health habits including diet and exercise and skin cancer prevention Reviewed appropriate screening tests for age  Also reviewed health mt list, fam hx and immunization status , as well as social and family history   See [facility] Labs ordered Declines mammograms -exam done and enc self breast exams Enc flu vaccine in the fall  PNA 23 vaccine given today  Enc to start ca and D for bone health and exercise  She is working on advance directive and almost done  No cognitive concerns  Reassuring hearing /vision exams        Other Visit Diagnoses    Need for 23-polyvalent pneumococcal polysaccharide vaccine       Relevant Orders   Pneumococcal polysaccharide vaccine 23-valent greater than or equal to 2yo subcutaneous/IM (Completed)

## 2019-03-04 ENCOUNTER — Encounter: Payer: Self-pay | Admitting: *Deleted

## 2019-03-04 LAB — CBC WITH DIFFERENTIAL/PLATELET
Basophils Absolute: 0.1 10*3/uL (ref 0.0–0.1)
Basophils Relative: 0.9 % (ref 0.0–3.0)
Eosinophils Absolute: 0.1 10*3/uL (ref 0.0–0.7)
Eosinophils Relative: 1.1 % (ref 0.0–5.0)
HCT: 45.5 % (ref 36.0–46.0)
Hemoglobin: 15.1 g/dL — ABNORMAL HIGH (ref 12.0–15.0)
Lymphocytes Relative: 22.8 % (ref 12.0–46.0)
Lymphs Abs: 1.6 10*3/uL (ref 0.7–4.0)
MCHC: 33.3 g/dL (ref 30.0–36.0)
MCV: 96.7 fl (ref 78.0–100.0)
Monocytes Absolute: 0.5 10*3/uL (ref 0.1–1.0)
Monocytes Relative: 6.9 % (ref 3.0–12.0)
Neutro Abs: 4.9 10*3/uL (ref 1.4–7.7)
Neutrophils Relative %: 68.3 % (ref 43.0–77.0)
Platelets: 277 10*3/uL (ref 150.0–400.0)
RBC: 4.7 Mil/uL (ref 3.87–5.11)
RDW: 12.9 % (ref 11.5–15.5)
WBC: 7.1 10*3/uL (ref 4.0–10.5)

## 2019-03-04 LAB — LIPID PANEL
Cholesterol: 258 mg/dL — ABNORMAL HIGH (ref 0–200)
HDL: 68.7 mg/dL (ref >39.00)
Total CHOL/HDL Ratio: 4
Triglycerides: 412 mg/dL — ABNORMAL HIGH (ref 0.0–149.0)

## 2019-03-04 LAB — COMPREHENSIVE METABOLIC PANEL
ALT: 17 U/L (ref 0–35)
AST: 17 U/L (ref 0–37)
Albumin: 4.4 g/dL (ref 3.5–5.2)
Alkaline Phosphatase: 62 U/L (ref 39–117)
BUN: 13 mg/dL (ref 6–23)
CO2: 29 mEq/L (ref 19–32)
Calcium: 9.7 mg/dL (ref 8.4–10.5)
Chloride: 101 mEq/L (ref 96–112)
Creatinine, Ser: 0.86 mg/dL (ref 0.40–1.20)
GFR: 65.63 mL/min (ref >60.00)
Glucose, Bld: 116 mg/dL — ABNORMAL HIGH (ref 70–99)
Potassium: 4 mEq/L (ref 3.5–5.1)
Sodium: 138 mEq/L (ref 135–145)
Total Bilirubin: 0.3 mg/dL (ref 0.2–1.2)
Total Protein: 6.6 g/dL (ref 6.0–8.3)

## 2019-03-04 LAB — TSH: TSH: 1.45 u[IU]/mL (ref 0.35–4.50)

## 2019-03-04 LAB — LDL CHOLESTEROL, DIRECT: Direct LDL: 148 mg/dL

## 2019-03-12 ENCOUNTER — Telehealth: Payer: Self-pay | Admitting: Family Medicine

## 2019-03-12 DIAGNOSIS — Z1231 Encounter for screening mammogram for malignant neoplasm of breast: Secondary | ICD-10-CM

## 2019-03-12 NOTE — Telephone Encounter (Signed)
Referral done  We were told to have pt call to make own appt  If she is unable to do that please route to whoever can  Thanks

## 2019-03-12 NOTE — Telephone Encounter (Signed)
Pt notified referral done and [facility] phone # given to pt so she can schedule her own mammogram

## 2019-03-12 NOTE — Telephone Encounter (Signed)
Patient called and would like for our office to go ahead and schedule the Mammogram for her.  She is requesting this to be done in [place] since it is more convenient for her.

## 2019-04-07 ENCOUNTER — Ambulatory Visit: Payer: Medicare Other

## 2019-04-11 ENCOUNTER — Ambulatory Visit
Admission: RE | Admit: 2019-04-11 | Discharge: 2019-04-11 | Disposition: A | Discharge status: Home / Self Care | Payer: Medicare Other | Source: Ambulatory Visit | Attending: Family Medicine | Admitting: Family Medicine

## 2019-04-11 DIAGNOSIS — Z1231 Encounter for screening mammogram for malignant neoplasm of breast: Secondary | ICD-10-CM | POA: Diagnosis not present

## 2019-05-06 DIAGNOSIS — Z23 Encounter for immunization: Secondary | ICD-10-CM | POA: Diagnosis not present

## 2019-06-10 ENCOUNTER — Other Ambulatory Visit: Payer: Self-pay

## 2019-07-22 ENCOUNTER — Telehealth: Payer: Self-pay | Admitting: Family Medicine

## 2019-07-22 MED ORDER — LANSOPRAZOLE 30 MG PO CPDR: 30.0000 mg | DELAYED_RELEASE_CAPSULE | Freq: Two times a day (BID) | ORAL | 1 refills | Status: DC

## 2019-07-22 MED ORDER — HYDROCHLOROTHIAZIDE 25 MG PO TABS: 25.0000 mg | ORAL_TABLET | Freq: Every day | ORAL | 1 refills | Status: DC

## 2019-07-22 MED ORDER — OLMESARTAN MEDOXOMIL 40 MG PO TABS: 40.0000 mg | ORAL_TABLET | Freq: Every day | ORAL | 1 refills | Status: DC

## 2019-07-22 NOTE — Telephone Encounter (Signed)
Patient requested refills  hydrochlorothiazide (HYDRODIURIL) 25 MG tablet   lansoprazole (PREVACID) 30 MG capsule  olmesartan (BENICAR) 40 MG tablet  She is requesting these be sent over to the   [facility]

## 2019-07-22 NOTE — Telephone Encounter (Signed)
Rx sent 

## 2019-10-16 ENCOUNTER — Other Ambulatory Visit: Payer: Self-pay | Admitting: Family Medicine

## 2020-01-18 ENCOUNTER — Other Ambulatory Visit: Payer: Self-pay | Admitting: Family Medicine

## 2020-04-27 DIAGNOSIS — Z23 Encounter for immunization: Secondary | ICD-10-CM | POA: Diagnosis not present

## 2020-06-04 ENCOUNTER — Other Ambulatory Visit: Payer: Self-pay | Admitting: Family Medicine

## 2020-06-04 NOTE — Telephone Encounter (Signed)
I left a message on patient's voice mail to return my call. Please schedule f/u or physical.

## 2020-06-04 NOTE — Telephone Encounter (Signed)
Med refilled once and [facility] will reach out to pt to try and get appts scheduled

## 2020-06-04 NOTE — Telephone Encounter (Signed)
Pt hasn't been seen in over a year and no future appts., please advise  

## 2020-06-04 NOTE — Telephone Encounter (Signed)
Please schedule f/u or PE and refill until then 

## 2020-07-21 ENCOUNTER — Ambulatory Visit (INDEPENDENT_AMBULATORY_CARE_PROVIDER_SITE_OTHER): Payer: Medicare Other

## 2020-07-21 DIAGNOSIS — Z0000 Encounter for general adult medical examination without abnormal findings: Secondary | ICD-10-CM

## 2020-07-21 NOTE — Progress Notes (Signed)
PCP notes:  Health Maintenance: Mammogram- due Dexa- due   Abnormal Screenings: none   Patient concerns: none   Nurse concerns: none   Next PCP appt.: 07/28/2020 @ 2 pm

## 2020-07-21 NOTE — Progress Notes (Signed)
Subjective:   Caroline Francis is a 70 y.o. female who presents for Medicare Annual (Subsequent) preventive examination.  Review of Systems: N/A      I connected with the patient today by telephone and verified that I am speaking with the correct person using two identifiers. Location patient: home Location nurse: work Persons participating in the telephone visit: patient, nurse.   I discussed the limitations, risks, security and privacy concerns of performing an evaluation and management service by telephone and the availability of in person appointments. I also discussed with the patient that there may be a patient responsible charge related to this service. The patient expressed understanding and verbally consented to this telephonic visit.        Cardiac Risk Factors include: advanced age (>92men, >34 women);hypertension     Objective:    Today's Vitals   There is no height or weight on file to calculate BMI.  Advanced Directives 07/21/2020  Does Patient Have a Medical Advance Directive? Yes  Type of Estate agent of [place];Living will  Copy of Healthcare Power of Attorney in Chart? No - copy requested    Current Medications (verified) Outpatient Encounter Medications as of 07/21/2020  Medication Sig  . hydrochlorothiazide (HYDRODIURIL) 25 MG tablet TAKE 1 TABLET DAILY  . lansoprazole (PREVACID) 30 MG capsule TAKE 1 CAPSULE TWICE DAILY  . lansoprazole (PREVACID) 30 MG capsule TAKE 1 CAPSULE TWICE DAILY  . olmesartan (BENICAR) 40 MG tablet TAKE 1 TABLET DAILY   No facility-administered encounter medications on file as of 07/21/2020.    Allergies (verified) Lisinopril-hydrochlorothiazide   History: Past Medical History:  Diagnosis Date  . GERD (gastroesophageal reflux disease)   . Hyperlipidemia   . Hypertension   . Low TSH level    Past Surgical History:  Procedure Laterality Date  . ABDOMINAL HYSTERECTOMY     partial /bleeding  . NASAL  SEPTUM SURGERY     History reviewed. No pertinent family history. Social History   Socioeconomic History  . Marital status: Divorced    Spouse name: Not on file  . Number of children: Not on file  . Years of education: Not on file  . Highest education level: Not on file  Occupational History  . Not on file  Tobacco Use  . Smoking status: Never Smoker  . Smokeless tobacco: Never Used  Substance and Sexual Activity  . Alcohol use: Yes    Comment: occasional glass of wine  . Drug use: No  . Sexual activity: Not on file  Other Topics Concern  . Not on file  Social History Narrative  . Not on file   Social Determinants of Health   Financial Resource Strain: Low Risk   . Difficulty of Paying Living Expenses: Not hard at all  Food Insecurity: No Food Insecurity  . Worried About Programme researcher, broadcasting/film/video in the Last Year: Never true  . Ran Out of Food in the Last Year: Never true  Transportation Needs: No Transportation Needs  . Lack of Transportation (Medical): No  . Lack of Transportation (Non-Medical): No  Physical Activity: Sufficiently Active  . Days of Exercise per Week: 7 days  . Minutes of Exercise per Session: 30 min  Stress: No Stress Concern Present  . Feeling of Stress : Not at all  Social Connections: Not on file    Tobacco Counseling Counseling given: Not Answered   Clinical Intake:  Pre-visit preparation completed: Yes  Pain : No/denies pain  Nutritional Risks: None Diabetes: No  How often do you need to have someone help you when you read instructions, pamphlets, or other written materials from your doctor or pharmacy?: 1 - Never What is the last grade level you completed in school?: associates  Diabetic: No Nutrition Risk Assessment:  Has the patient had any N/V/D within the last 2 months?  No  Does the patient have any non-healing wounds?  No  Has the patient had any unintentional weight loss or weight gain?  No   Diabetes:  Is the patient  diabetic?  No  If diabetic, was a CBG obtained today?  N/A Did the patient bring in their glucometer from home?  N/A How often do you monitor your CBG's? N/A.   Financial Strains and Diabetes Management:  Are you having any financial strains with the device, your supplies or your medication? N/A.  Does the patient want to be seen by Chronic Care Management for management of their diabetes?  N/A Would the patient like to be referred to a Nutritionist or for Diabetic Management?  N/A    Interpreter Needed?: No  Information entered by :: CJohnson, LPN   Activities of Daily Living In your present state of health, do you have any difficulty performing the following activities: 07/21/2020  Hearing? N  Vision? N  Difficulty concentrating or making decisions? N  Walking or climbing stairs? N  Dressing or bathing? N  Doing errands, shopping? N  Preparing Food and eating ? N  Using the Toilet? N  In the past six months, have you accidently leaked urine? N  Do you have problems with loss of bowel control? N  Managing your Medications? N  Managing your Finances? N  Housekeeping or managing your Housekeeping? N  Some recent data might be hidden    Patient Care Team: Tower, Wynelle Fanny, MD as PCP - General  Indicate any recent Medical Services you may have received from other than [facility] providers in the past year (date may be approximate).     Assessment:   This is a routine wellness examination for [place].  Hearing/Vision screen  Hearing Screening   125Hz  250Hz  500Hz  1000Hz  2000Hz  3000Hz  4000Hz  6000Hz  8000Hz   Right ear:           Left ear:           Vision Screening Comments: Patient gets annual eye exams   Dietary issues and exercise activities discussed: Current Exercise Habits: Home exercise routine, Type of exercise: walking, Time (Minutes): 30, Frequency (Times/Week): 7, Weekly Exercise (Minutes/Week): 210, Intensity: Moderate, Exercise limited by: None identified  Goals     . Patient Stated     07/21/2020, I will continue to walk my dog everyday for 1-2 miles.       Depression Screen PHQ 2/9 Scores 07/21/2020 03/03/2019 09/19/2017  PHQ - 2 Score 0 0 1  PHQ- 9 Score 0 - -    Fall Risk Fall Risk  07/21/2020 06/10/2019 03/03/2019 09/19/2017  Falls in the past year? 0 0 0 No  Comment - Emmi Telephone Survey: data to providers prior to load - -  Number falls in past yr: 0 - 0 -  Injury with Fall? 0 - 0 -  Risk for fall due to : Medication side effect - - -  Follow up Falls evaluation completed;Falls prevention discussed - Falls evaluation completed -    FALL RISK PREVENTION PERTAINING TO THE HOME:  Any stairs in or around the home? Yes  If so, are there any without handrails? No  Home free of loose throw rugs in walkways, pet beds, electrical cords, etc? Yes  Adequate lighting in your home to reduce risk of falls? Yes   ASSISTIVE DEVICES UTILIZED TO PREVENT FALLS:  Life alert? No  Use of a cane, walker or w/c? No  Grab bars in the bathroom? No  Shower chair or bench in shower? No  Elevated toilet seat or a handicapped toilet? No   TIMED UP AND GO:  Was the test performed? N/A, telephone visit.    Cognitive Function: MMSE - Mini Mental State Exam 07/21/2020  Orientation to time 5  Orientation to Place 5  Registration 3  Attention/ Calculation 5  Recall 3  Language- repeat 1       Mini Cog  Mini-Cog screen was completed. Maximum score is 22. A value of 0 denotes this part of the MMSE was not completed or the patient failed this part of the Mini-Cog screening.  Immunizations Immunization History  Administered Date(s) Administered  . Influenza, High Dose Seasonal PF 05/06/2019  . Influenza-Unspecified 08/03/2017, 06/16/2018, 04/27/2020  . PFIZER SARS-COV-2 Vaccination 08/28/2019, 09/18/2019, 04/27/2020  . Pneumococcal Conjugate-13 09/19/2017  . Pneumococcal Polysaccharide-23 03/03/2019  . Td 12/22/2003  . Tdap 04/15/2014, 07/26/2017  . Zoster  05/01/2012  . Zoster Recombinat (Shingrix) 09/21/2017, 12/28/2017    TDAP status: Up to date  Flu Vaccine status: Up to date  Pneumococcal vaccine status: Up to date  Covid-19 vaccine status: Completed vaccines  Qualifies for Shingles Vaccine? Yes   Zostavax completed Yes   Shingrix Completed?: Yes  Screening Tests Health Maintenance  Topic Date Due  . MAMMOGRAM  04/10/2020  . Hepatitis C Screening  09/20/2027 (Originally 1950-09-26)  . COLONOSCOPY (Pts 45-70yrs Insurance coverage will need to be confirmed)  09/05/2026  . TETANUS/TDAP  07/27/2027  . INFLUENZA VACCINE  Completed  . DEXA SCAN  Completed  . COVID-19 Vaccine  Completed  . PNA vac Low Risk Adult  Completed    Health Maintenance  Health Maintenance Due  Topic Date Due  . MAMMOGRAM  04/10/2020    Colorectal cancer screening: Type of screening: Colonoscopy. Completed 09/05/2016. Repeat every 10 years  Mammogram status: due, will discuss with provider at physical   Bone Density status: due, will discuss with provider at physical    Lung Cancer Screening: (Low Dose CT Chest recommended if Age 45-80 years, 30 pack-year currently smoking OR have quit w/in 15 years.) does not qualify.    Additional Screening:  Hepatitis C Screening: does qualify; Completed declined  Vision Screening: Recommended annual ophthalmology exams for early detection of glaucoma and other disorders of the eye. Is the patient up to date with their annual eye exam?  Yes  Who is the provider or what is the name of the office in which the patient attends annual eye exams? [facility] If pt is not established with a provider, would they like to be referred to a provider to establish care? No .   Dental Screening: Recommended annual dental exams for proper oral hygiene  Community Resource Referral / Chronic Care Management: CRR required this visit?  No   CCM required this visit?  No      Plan:     I have personally reviewed and  noted the following in the patient's chart:   . Medical and social history . Use of alcohol, tobacco or illicit drugs  . Current medications and supplements . Functional ability and status .  Nutritional status . Physical activity . Advanced directives . List of other physicians . Hospitalizations, surgeries, and ER visits in previous 12 months . Vitals . Screenings to include cognitive, depression, and falls . Referrals and appointments  In addition, I have reviewed and discussed with patient certain preventive protocols, quality metrics, and best practice recommendations. A written personalized care plan for preventive services as well as general preventive health recommendations were provided to patient.   Due to this being a telephonic visit, the after visit summary with patients personalized plan was offered to patient via office or my-chart. Patient preferred to pick up at office at next visit or via mychart.   Janalyn Shy, LPN   07/25/6220

## 2020-07-21 NOTE — Patient Instructions (Signed)
Caroline Francis , Thank you for taking time to come for your Medicare Wellness Visit. I appreciate your ongoing commitment to your health goals. Please review the following plan we discussed and let me know if I can assist you in the future.   Screening recommendations/referrals: Colonoscopy: Up to date, completed 09/05/2016, due 08/2026 Mammogram: due, will discuss with provider Bone Density: due, will discuss with provider Recommended yearly ophthalmology/optometry visit for glaucoma screening and checkup Recommended yearly dental visit for hygiene and checkup  Vaccinations: Influenza vaccine: Up to date, completed 04/27/2020, due 02/2021 Pneumococcal vaccine: Completed series Tdap vaccine: Up to date, completed 07/26/2017, due 07/2027 Shingles vaccine: Completed series   Covid-19:Completed series  Advanced directives: Please bring a copy of your POA (Power of Attorney) and/or Living Will to your next appointment.   Conditions/risks identified: hypertension  Next appointment: Follow up in one year for your annual wellness visit    Preventive Care 65 Years and Older, Female Preventive care refers to lifestyle choices and visits with your health care provider that can promote health and wellness. What does preventive care include?  A yearly physical exam. This is also called an annual well check.  Dental exams once or twice a year.  Routine eye exams. Ask your health care provider how often you should have your eyes checked.  Personal lifestyle choices, including:  Daily care of your teeth and gums.  Regular physical activity.  Eating a healthy diet.  Avoiding tobacco and drug use.  Limiting alcohol use.  Practicing safe sex.  Taking low-dose aspirin every day.  Taking vitamin and mineral supplements as recommended by your health care provider. What happens during an annual well check? The services and screenings done by your health care provider during your annual well  check will depend on your age, overall health, lifestyle risk factors, and family history of disease. Counseling  Your health care provider may ask you questions about your:  Alcohol use.  Tobacco use.  Drug use.  Emotional well-being.  Home and relationship well-being.  Sexual activity.  Eating habits.  History of falls.  Memory and ability to understand (cognition).  Work and work Astronomer.  Reproductive health. Screening  You may have the following tests or measurements:  Height, weight, and BMI.  Blood pressure.  Lipid and cholesterol levels. These may be checked every 5 years, or more frequently if you are over 75 years old.  Skin check.  Lung cancer screening. You may have this screening every year starting at age 36 if you have a 30-pack-year history of smoking and currently smoke or have quit within the past 15 years.  Fecal occult blood test (FOBT) of the stool. You may have this test every year starting at age 28.  Flexible sigmoidoscopy or colonoscopy. You may have a sigmoidoscopy every 5 years or a colonoscopy every 10 years starting at age 39.  Hepatitis C blood test.  Hepatitis B blood test.  Sexually transmitted disease (STD) testing.  Diabetes screening. This is done by checking your blood sugar (glucose) after you have not eaten for a while (fasting). You may have this done every 1-3 years.  Bone density scan. This is done to screen for osteoporosis. You may have this done starting at age 89.  Mammogram. This may be done every 1-2 years. Talk to your health care provider about how often you should have regular mammograms. Talk with your health care provider about your test results, treatment options, and if necessary, the need for  more tests. Vaccines  Your health care provider may recommend certain vaccines, such as:  Influenza vaccine. This is recommended every year.  Tetanus, diphtheria, and acellular pertussis (Tdap, Td) vaccine. You  may need a Td booster every 10 years.  Zoster vaccine. You may need this after age 33.  Pneumococcal 13-valent conjugate (PCV13) vaccine. One dose is recommended after age 45.  Pneumococcal polysaccharide (PPSV23) vaccine. One dose is recommended after age 20. Talk to your health care provider about which screenings and vaccines you need and how often you need them. This information is not intended to replace advice given to you by your health care provider. Make sure you discuss any questions you have with your health care provider. Document Released: 07/30/2015 Document Revised: 03/22/2016 Document Reviewed: 05/04/2015 Elsevier Interactive Patient Education  2017 [employer] Prevention in the Home Falls can cause injuries. They can happen to people of all ages. There are many things you can do to make your home safe and to help prevent falls. What can I do on the outside of my home?  Regularly fix the edges of walkways and driveways and fix any cracks.  Remove anything that might make you trip as you walk through a door, such as a raised step or threshold.  Trim any bushes or trees on the path to your home.  Use bright outdoor lighting.  Clear any walking paths of anything that might make someone trip, such as rocks or tools.  Regularly check to see if handrails are loose or broken. Make sure that both sides of any steps have handrails.  Any raised decks and porches should have guardrails on the edges.  Have any leaves, snow, or ice cleared regularly.  Use sand or salt on walking paths during winter.  Clean up any spills in your garage right away. This includes oil or grease spills. What can I do in the bathroom?  Use night lights.  Install grab bars by the toilet and in the tub and shower. Do not use towel bars as grab bars.  Use non-skid mats or decals in the tub or shower.  If you need to sit down in the shower, use a plastic, non-slip stool.  Keep the floor  dry. Clean up any water that spills on the floor as soon as it happens.  Remove soap buildup in the tub or shower regularly.  Attach bath mats securely with double-sided non-slip rug tape.  Do not have throw rugs and other things on the floor that can make you trip. What can I do in the bedroom?  Use night lights.  Make sure that you have a light by your bed that is easy to reach.  Do not use any sheets or blankets that are too big for your bed. They should not hang down onto the floor.  Have a firm chair that has side arms. You can use this for support while you get dressed.  Do not have throw rugs and other things on the floor that can make you trip. What can I do in the kitchen?  Clean up any spills right away.  Avoid walking on wet floors.  Keep items that you use a lot in easy-to-reach places.  If you need to reach something above you, use a strong step stool that has a grab bar.  Keep electrical cords out of the way.  Do not use floor polish or wax that makes floors slippery. If you must use wax, use non-skid  floor wax.  Do not have throw rugs and other things on the floor that can make you trip. What can I do with my stairs?  Do not leave any items on the stairs.  Make sure that there are handrails on both sides of the stairs and use them. Fix handrails that are broken or loose. Make sure that handrails are as long as the stairways.  Check any carpeting to make sure that it is firmly attached to the stairs. Fix any carpet that is loose or worn.  Avoid having throw rugs at the top or bottom of the stairs. If you do have throw rugs, attach them to the floor with carpet tape.  Make sure that you have a light switch at the top of the stairs and the bottom of the stairs. If you do not have them, ask someone to add them for you. What else can I do to help prevent falls?  Wear shoes that:  Do not have high heels.  Have rubber bottoms.  Are comfortable and fit you  well.  Are closed at the toe. Do not wear sandals.  If you use a stepladder:  Make sure that it is fully opened. Do not climb a closed stepladder.  Make sure that both sides of the stepladder are locked into place.  Ask someone to hold it for you, if possible.  Clearly mark and make sure that you can see:  Any grab bars or handrails.  First and last steps.  Where the edge of each step is.  Use tools that help you move around (mobility aids) if they are needed. These include:  Canes.  Walkers.  Scooters.  Crutches.  Turn on the lights when you go into a dark area. Replace any light bulbs as soon as they burn out.  Set up your furniture so you have a clear path. Avoid moving your furniture around.  If any of your floors are uneven, fix them.  If there are any pets around you, be aware of where they are.  Review your medicines with your doctor. Some medicines can make you feel dizzy. This can increase your chance of falling. Ask your doctor what other things that you can do to help prevent falls. This information is not intended to replace advice given to you by your health care provider. Make sure you discuss any questions you have with your health care provider. Document Released: 04/29/2009 Document Revised: 12/09/2015 Document Reviewed: 08/07/2014 Elsevier Interactive Patient Education  2017 [employer].

## 2020-07-27 ENCOUNTER — Ambulatory Visit: Payer: Medicare Other

## 2020-07-28 ENCOUNTER — Other Ambulatory Visit: Payer: Self-pay

## 2020-07-28 ENCOUNTER — Ambulatory Visit (INDEPENDENT_AMBULATORY_CARE_PROVIDER_SITE_OTHER): Payer: Medicare Other | Admitting: Family Medicine

## 2020-07-28 ENCOUNTER — Encounter: Payer: Self-pay | Admitting: Family Medicine

## 2020-07-28 VITALS — BP 120/68 | HR 72 | Temp 96.9°F | Ht 66.5 in | Wt 165.4 lb

## 2020-07-28 DIAGNOSIS — E0590 Thyrotoxicosis, unspecified without thyrotoxic crisis or storm: Secondary | ICD-10-CM

## 2020-07-28 DIAGNOSIS — Z0000 Encounter for general adult medical examination without abnormal findings: Secondary | ICD-10-CM | POA: Diagnosis not present

## 2020-07-28 DIAGNOSIS — R7309 Other abnormal glucose: Secondary | ICD-10-CM

## 2020-07-28 DIAGNOSIS — E2839 Other primary ovarian failure: Secondary | ICD-10-CM

## 2020-07-28 DIAGNOSIS — K219 Gastro-esophageal reflux disease without esophagitis: Secondary | ICD-10-CM

## 2020-07-28 DIAGNOSIS — M8580 Other specified disorders of bone density and structure, unspecified site: Secondary | ICD-10-CM

## 2020-07-28 DIAGNOSIS — E7800 Pure hypercholesterolemia, unspecified: Secondary | ICD-10-CM

## 2020-07-28 DIAGNOSIS — Z79899 Other long term (current) drug therapy: Secondary | ICD-10-CM | POA: Insufficient documentation

## 2020-07-28 DIAGNOSIS — I10 Essential (primary) hypertension: Secondary | ICD-10-CM

## 2020-07-28 MED ORDER — OLMESARTAN MEDOXOMIL 40 MG PO TABS: 40.0000 mg | ORAL_TABLET | Freq: Every day | ORAL | 3 refills | Status: DC

## 2020-07-28 MED ORDER — HYDROCHLOROTHIAZIDE 25 MG PO TABS: 25.0000 mg | ORAL_TABLET | Freq: Every day | ORAL | 3 refills | Status: DC

## 2020-07-28 MED ORDER — LANSOPRAZOLE 30 MG PO CPDR: 30.0000 mg | DELAYED_RELEASE_CAPSULE | Freq: Two times a day (BID) | ORAL | 3 refills | Status: DC

## 2020-07-28 NOTE — Assessment & Plan Note (Signed)
Continues lansoprazole 30 mg bid (not well controlled with qd)  Disc poss of low B12 or vit D with this Added these to labs Disc diet changes for GERD

## 2020-07-28 NOTE — Patient Instructions (Signed)
Call the [facility] to set up mammogram and bone density test   Keep taking good care of yourself  Stay safe   Labs today

## 2020-07-28 NOTE — Assessment & Plan Note (Signed)
This may cause abs issues  Vit D and B12 levels drawn today

## 2020-07-28 NOTE — Assessment & Plan Note (Signed)
No symptoms/problems TSH today

## 2020-07-28 NOTE — Assessment & Plan Note (Addendum)
Reviewed health habits including diet and exercise and skin cancer prevention Reviewed appropriate screening tests for age  Also reviewed health mt list, fam hx and immunization status , as well as social and family history   See HPI Labs ordered  amw reviewed  Pt plans to schedule mammogram and dexa at [facility]-info given  No falls or fractures Taking ca and D Vaccinations utd including covid vaccines  Great exercise habits

## 2020-07-28 NOTE — Progress Notes (Signed)
Subjective:    Patient ID: Caroline Francis, female    DOB: Jul 04, 1951, 70 y.o.   MRN: [id number]  This visit occurred during the SARS-CoV-2 public health emergency.  Safety protocols were in place, including screening questions prior to the visit, additional usage of staff PPE, and extensive cleaning of exam room while observing appropriate contact time as indicated for disinfecting solutions.    HPI Here for health maintenance exam and to review chronic medical problems   Wt Readings from Last 3 Encounters:  07/28/20 165 lb 6 oz (75 kg)  03/03/19 164 lb 4 oz (74.5 kg)  09/19/17 164 lb 8 oz (74.6 kg)   26.29 kg/m  Not doing a lot lately  Swims 3 d per week and walks dog other days (even in the cold weather)    Had amw on 1/5  Noted mammo due- wants to get that  Last was 9/20 sef breast exam no lumps   Colonoscopy 2/18   dexa 4/19 osteopenia Will schedule next  Falls - none  Fractures -none Supplements ca and D Good exercise    covid vaccinated with booster Other vaccines utd   Due for labs   HTN bp is stable today  No cp or palpitations or headaches or edema  No side effects to medicines  BP Readings from Last 3 Encounters:  07/28/20 120/68  03/03/19 132/78  09/19/17 122/64    Takes hctz 25 mg daily and benicar 40 mg daily Pulse Readings from Last 3 Encounters:  07/28/20 72  03/03/19 62  09/19/17 74    GERD- takes lansoprazole Is well controlled    Hyperthyroidism- no problems  Had RI tx in the past  Needs TSH today   Hyperlipidemia Diet controlled Lab Results  Component Value Date   CHOL 258 (H) 03/03/2019   HDL 68.70 03/03/2019   LDLCALC 127 (H) 09/06/2017   LDLDIRECT 148.0 03/03/2019   TRIG (H) 03/03/2019    412.0 Triglyceride is over 400; calculations on Lipids are invalid.   CHOLHDL 4 03/03/2019   Due for labs  Watching diet   Elevated glucose level in past-will check a1c today  Diet is fairly good, mindful about sugar     Patient Active Problem List   Diagnosis Date Noted  . Current use of proton pump inhibitor 07/28/2020  . Screening mammogram, encounter for 03/12/2019  . Medicare annual wellness visit, initial 03/03/2019  . Osteopenia 11/04/2017  . Welcome to Medicare preventive visit 09/19/2017  . Estrogen deficiency 09/19/2017  . Elevated glucose level 09/19/2017  . Osteoarthritis of hip 07/26/2015  . Pain in left hip 07/23/2015  . External hemorrhoids 04/09/2012  . Routine general medical examination at a health care facility 04/02/2012  . SNORING, HX OF 05/04/2010  . Hyperthyroidism 03/31/2010  . Hyperlipidemia 12/25/2007  . Essential hypertension 12/25/2007  . GERD 12/25/2007  . HIATAL HERNIA 12/25/2007   Past Medical History:  Diagnosis Date  . GERD (gastroesophageal reflux disease)   . Hyperlipidemia   . Hypertension   . Low TSH level    Past Surgical History:  Procedure Laterality Date  . ABDOMINAL HYSTERECTOMY     partial /bleeding  . NASAL SEPTUM SURGERY     Social History   Tobacco Use  . Smoking status: Never Smoker  . Smokeless tobacco: Never Used  Substance Use Topics  . Alcohol use: Yes    Comment: occasional glass of wine  . Drug use: No   No family history on file. Allergies  Allergen Reactions  . Lisinopril-Hydrochlorothiazide Cough    REACTION: side effect 03/30/10 Pt said not allergic and no adverse reactions to any meds.   No current outpatient medications on file prior to visit.   No current facility-administered medications on file prior to visit.    Review of Systems  Constitutional: Negative for activity change, appetite change, fatigue, fever and unexpected weight change.  HENT: Negative for congestion, ear pain, rhinorrhea, sinus pressure and sore throat.   Eyes: Negative for pain, redness and visual disturbance.  Respiratory: Negative for cough, shortness of breath and wheezing.   Cardiovascular: Negative for chest pain and palpitations.   Gastrointestinal: Negative for abdominal pain, blood in stool, constipation and diarrhea.  Endocrine: Negative for polydipsia and polyuria.  Genitourinary: Negative for dysuria, frequency and urgency.  Musculoskeletal: Positive for arthralgias. Negative for back pain and myalgias.  Skin: Negative for pallor and rash.  Allergic/Immunologic: Negative for environmental allergies.  Neurological: Negative for dizziness, syncope and headaches.  Hematological: Negative for adenopathy. Does not bruise/bleed easily.  Psychiatric/Behavioral: Negative for decreased concentration and dysphoric mood. The patient is not nervous/anxious.        Objective:   Physical Exam Constitutional:      General: She is not in acute distress.    Appearance: Normal appearance. She is well-developed and normal weight. She is not ill-appearing or diaphoretic.  HENT:     Head: Normocephalic and atraumatic.     Right Ear: Tympanic membrane, ear canal and external ear normal.     Left Ear: Tympanic membrane, ear canal and external ear normal.     Nose: Nose normal. No congestion.     Mouth/Throat:     Mouth: Mucous membranes are moist.     Pharynx: Oropharynx is clear. No posterior oropharyngeal erythema.  Eyes:     General: No scleral icterus.    Extraocular Movements: Extraocular movements intact.     Conjunctiva/sclera: Conjunctivae normal.     Pupils: Pupils are equal, round, and reactive to light.  Neck:     Thyroid: No thyromegaly.     Vascular: No carotid bruit or JVD.  Cardiovascular:     Rate and Rhythm: Normal rate and regular rhythm.     Pulses: Normal pulses.     Heart sounds: Normal heart sounds. No gallop.   Pulmonary:     Effort: Pulmonary effort is normal. No respiratory distress.     Breath sounds: Normal breath sounds. No wheezing.     Comments: Good air exch Chest:     Chest wall: No tenderness.  Abdominal:     General: Bowel sounds are normal. There is no distension or abdominal  bruit.     Palpations: Abdomen is soft. There is no mass.     Tenderness: There is no abdominal tenderness.     Hernia: No hernia is present.  Genitourinary:    Comments: Breast exam: No mass, nodules, thickening, tenderness, bulging, retraction, inflamation, nipple discharge or skin changes noted.  No axillary or clavicular LA.     Musculoskeletal:        General: No tenderness. Normal range of motion.     Cervical back: Normal range of motion and neck supple. No rigidity. No muscular tenderness.     Right lower leg: No edema.     Left lower leg: No edema.     Comments: No kyphosis   Lymphadenopathy:     Cervical: No cervical adenopathy.  Skin:    General: Skin is warm and  dry.     Coloration: Skin is not pale.     Findings: No erythema or rash.     Comments: Solar lentigines diffusely Fair complexion   Neurological:     Mental Status: She is alert. Mental status is at baseline.     Cranial Nerves: No cranial nerve deficit.     Motor: No abnormal muscle tone.     Coordination: Coordination normal.     Gait: Gait normal.     Deep Tendon Reflexes: Reflexes are normal and symmetric. Reflexes normal.  Psychiatric:        Mood and Affect: Mood normal.        Cognition and Memory: Cognition and memory normal.           Assessment & Plan:   Problem List Items Addressed This Visit      Cardiovascular and Mediastinum   Essential hypertension    bp in fair control at this time  BP Readings from Last 1 Encounters:  07/28/20 120/68   No changes needed Most recent labs reviewed  Disc lifstyle change with low sodium diet and exercise  Plan to continue hctz 25 mg daily and benicar 40 mg daily  Labs ordered      Relevant Medications   hydrochlorothiazide (HYDRODIURIL) 25 MG tablet   olmesartan (BENICAR) 40 MG tablet   Other Relevant Orders   CBC with Differential/Platelet   Comprehensive metabolic panel   Lipid panel   TSH     Digestive   GERD    Continues  lansoprazole 30 mg bid (not well controlled with qd)  Disc poss of low B12 or vit D with this Added these to labs Disc diet changes for GERD      Relevant Medications   lansoprazole (PREVACID) 30 MG capsule     Endocrine   Hyperthyroidism    No symptoms/problems TSH today      Relevant Orders   TSH     Musculoskeletal and Integument   Osteopenia    Due for 2 y dexa-ordered  No falls or fx Taking ca and D Disc exercise- very good          Other   Hyperlipidemia    Disc goals for lipids and reasons to control them Rev last labs with pt Rev low sat fat diet in detail Is mindful about fats in diet  Labs today      Relevant Medications   hydrochlorothiazide (HYDRODIURIL) 25 MG tablet   olmesartan (BENICAR) 40 MG tablet   Other Relevant Orders   Lipid panel   Routine general medical examination at a health care facility - Primary    Reviewed health habits including diet and exercise and skin cancer prevention Reviewed appropriate screening tests for age  Also reviewed health mt list, fam hx and immunization status , as well as social and family history   See HPI Labs ordered  amw reviewed  Pt plans to schedule mammogram and dexa at [facility]-info given  No falls or fractures Taking ca and D Vaccinations utd including covid vaccines  Great exercise habits        Estrogen deficiency   Relevant Orders   DG Bone Density   Elevated glucose level    A1C drawn today  Good diet and exercise disc imp of low glycemic diet and wt loss to prevent DM2       Relevant Orders   Hemoglobin A1c   Current use of proton pump inhibitor    This  may cause abs issues  Vit D and B12 levels drawn today      Relevant Orders   VITAMIN D 25 Hydroxy (Vit-D Deficiency, Fractures)   Vitamin B12

## 2020-07-28 NOTE — Assessment & Plan Note (Signed)
bp in fair control at this time  BP Readings from Last 1 Encounters:  07/28/20 120/68   No changes needed Most recent labs reviewed  Disc lifstyle change with low sodium diet and exercise  Plan to continue hctz 25 mg daily and benicar 40 mg daily  Labs ordered

## 2020-07-28 NOTE — Assessment & Plan Note (Signed)
Due for 2 y dexa-ordered  No falls or fx Taking ca and D Disc exercise- very good

## 2020-07-28 NOTE — Assessment & Plan Note (Signed)
A1C drawn today  Good diet and exercise disc imp of low glycemic diet and wt loss to prevent DM2

## 2020-07-28 NOTE — Assessment & Plan Note (Signed)
Disc goals for lipids and reasons to control them Rev last labs with pt Rev low sat fat diet in detail Is mindful about fats in diet  Labs today

## 2020-07-29 ENCOUNTER — Encounter: Payer: Self-pay | Admitting: Family Medicine

## 2020-07-29 DIAGNOSIS — E559 Vitamin D deficiency, unspecified: Secondary | ICD-10-CM | POA: Insufficient documentation

## 2020-07-29 DIAGNOSIS — E538 Deficiency of other specified B group vitamins: Secondary | ICD-10-CM | POA: Insufficient documentation

## 2020-07-29 LAB — COMPREHENSIVE METABOLIC PANEL
ALT: 13 U/L (ref 0–35)
AST: 16 U/L (ref 0–37)
Albumin: 4.3 g/dL (ref 3.5–5.2)
Alkaline Phosphatase: 67 U/L (ref 39–117)
BUN: 10 mg/dL (ref 6–23)
CO2: 27 mEq/L (ref 19–32)
Calcium: 9.4 mg/dL (ref 8.4–10.5)
Chloride: 99 mEq/L (ref 96–112)
Creatinine, Ser: 0.67 mg/dL (ref 0.40–1.20)
GFR: 89.22 mL/min (ref >60.00)
Glucose, Bld: 104 mg/dL — ABNORMAL HIGH (ref 70–99)
Potassium: 3.7 mEq/L (ref 3.5–5.1)
Sodium: 135 mEq/L (ref 135–145)
Total Bilirubin: 0.6 mg/dL (ref 0.2–1.2)
Total Protein: 6.3 g/dL (ref 6.0–8.3)

## 2020-07-29 LAB — LIPID PANEL
Cholesterol: 248 mg/dL — ABNORMAL HIGH (ref 0–200)
HDL: 78.1 mg/dL (ref >39.00)
LDL Cholesterol: 133 mg/dL — ABNORMAL HIGH (ref 0–99)
NonHDL: 170.39
Total CHOL/HDL Ratio: 3
Triglycerides: 185 mg/dL — ABNORMAL HIGH (ref 0.0–149.0)
VLDL: 37 mg/dL (ref 0.0–40.0)

## 2020-07-29 LAB — CBC WITH DIFFERENTIAL/PLATELET
Basophils Absolute: 0.1 10*3/uL (ref 0.0–0.1)
Basophils Relative: 1.3 % (ref 0.0–3.0)
Eosinophils Absolute: 0.1 10*3/uL (ref 0.0–0.7)
Eosinophils Relative: 2 % (ref 0.0–5.0)
HCT: 41.9 % (ref 36.0–46.0)
Hemoglobin: 14 g/dL (ref 12.0–15.0)
Lymphocytes Relative: 24.4 % (ref 12.0–46.0)
Lymphs Abs: 1.8 10*3/uL (ref 0.7–4.0)
MCHC: 33.4 g/dL (ref 30.0–36.0)
MCV: 93 fl (ref 78.0–100.0)
Monocytes Absolute: 0.4 10*3/uL (ref 0.1–1.0)
Monocytes Relative: 5.3 % (ref 3.0–12.0)
Neutro Abs: 5 10*3/uL (ref 1.4–7.7)
Neutrophils Relative %: 67 % (ref 43.0–77.0)
Platelets: 296 10*3/uL (ref 150.0–400.0)
RBC: 4.51 Mil/uL (ref 3.87–5.11)
RDW: 13.1 % (ref 11.5–15.5)
WBC: 7.4 10*3/uL (ref 4.0–10.5)

## 2020-07-29 LAB — VITAMIN D 25 HYDROXY (VIT D DEFICIENCY, FRACTURES): VITD: 16.6 ng/mL — ABNORMAL LOW (ref 30.00–100.00)

## 2020-07-29 LAB — HEMOGLOBIN A1C: Hgb A1c MFr Bld: 5.4 % (ref 4.6–6.5)

## 2020-07-29 LAB — VITAMIN B12: Vitamin B-12: 167 pg/mL — ABNORMAL LOW (ref 211–911)

## 2020-07-29 LAB — TSH: TSH: 1.19 u[IU]/mL (ref 0.35–4.50)

## 2020-08-12 ENCOUNTER — Ambulatory Visit (INDEPENDENT_AMBULATORY_CARE_PROVIDER_SITE_OTHER): Payer: Medicare Other

## 2020-08-12 DIAGNOSIS — E538 Deficiency of other specified B group vitamins: Secondary | ICD-10-CM

## 2020-08-12 MED ORDER — CYANOCOBALAMIN 1000 MCG/ML IJ SOLN
1000.0000 ug | Freq: Once | INTRAMUSCULAR | Status: AC
  Administered 2020-08-12: 1000 ug via INTRAMUSCULAR

## 2020-08-12 NOTE — Progress Notes (Signed)
Per orders of Dr. Sharlene Motts, injection of vit I71 given by Brenton Grills. Patient tolerated injection well.

## 2020-08-19 ENCOUNTER — Other Ambulatory Visit: Payer: Self-pay

## 2020-08-19 ENCOUNTER — Ambulatory Visit (INDEPENDENT_AMBULATORY_CARE_PROVIDER_SITE_OTHER): Payer: Medicare Other

## 2020-08-19 DIAGNOSIS — E538 Deficiency of other specified B group vitamins: Secondary | ICD-10-CM | POA: Diagnosis not present

## 2020-08-19 MED ORDER — CYANOCOBALAMIN 1000 MCG/ML IJ SOLN
1000.0000 ug | Freq: Once | INTRAMUSCULAR | Status: AC
Start: 2020-08-19 — End: 2020-08-19
  Administered 2020-08-19: 1000 ug via INTRAMUSCULAR

## 2020-08-19 NOTE — Progress Notes (Signed)
Per orders of Dr. Damita Dunnings, in Dr. Alba Cory absence, 2nd weekly injection of B12 given in R deltoid, by Loreen Freud. Patient tolerated injection well.

## 2020-08-23 ENCOUNTER — Other Ambulatory Visit: Payer: Self-pay | Admitting: Family Medicine

## 2020-08-23 DIAGNOSIS — Z1231 Encounter for screening mammogram for malignant neoplasm of breast: Secondary | ICD-10-CM

## 2020-08-26 ENCOUNTER — Other Ambulatory Visit: Payer: Self-pay

## 2020-08-26 ENCOUNTER — Ambulatory Visit (INDEPENDENT_AMBULATORY_CARE_PROVIDER_SITE_OTHER): Payer: Medicare Other

## 2020-08-26 DIAGNOSIS — E538 Deficiency of other specified B group vitamins: Secondary | ICD-10-CM

## 2020-08-26 MED ORDER — CYANOCOBALAMIN 1000 MCG/ML IJ SOLN
1000.0000 ug | Freq: Once | INTRAMUSCULAR | Status: AC
  Administered 2020-08-26: 1000 ug via INTRAMUSCULAR

## 2020-08-26 NOTE — Progress Notes (Signed)
Per orders of Dr. Duncan, injection of vit B12 given by Regis Hinton. Patient tolerated injection well.  

## 2020-09-02 ENCOUNTER — Ambulatory Visit (INDEPENDENT_AMBULATORY_CARE_PROVIDER_SITE_OTHER): Payer: Medicare Other

## 2020-09-02 ENCOUNTER — Other Ambulatory Visit: Payer: Self-pay

## 2020-09-02 DIAGNOSIS — E538 Deficiency of other specified B group vitamins: Secondary | ICD-10-CM | POA: Diagnosis not present

## 2020-09-02 MED ORDER — CYANOCOBALAMIN 1000 MCG/ML IJ SOLN
1000.0000 ug | Freq: Once | INTRAMUSCULAR | Status: AC
Start: 2020-09-02 — End: 2020-09-02
  Administered 2020-09-02: 1000 ug via INTRAMUSCULAR

## 2020-09-02 NOTE — Progress Notes (Signed)
Per orders of Dr. Damita Dunnings, in Dr. Kalman Shan weekly injection of B12 given in R deltoid, by Loreen Freud. Patient tolerated injection well.

## 2020-09-14 ENCOUNTER — Other Ambulatory Visit: Payer: Medicare Other

## 2020-09-29 ENCOUNTER — Ambulatory Visit (INDEPENDENT_AMBULATORY_CARE_PROVIDER_SITE_OTHER): Payer: Medicare Other | Admitting: Internal Medicine

## 2020-09-29 ENCOUNTER — Encounter: Payer: Self-pay | Admitting: Internal Medicine

## 2020-09-29 ENCOUNTER — Other Ambulatory Visit: Payer: Self-pay

## 2020-09-29 VITALS — BP 124/82 | HR 90 | Temp 96.8°F | Wt 166.0 lb

## 2020-09-29 DIAGNOSIS — M79605 Pain in left leg: Secondary | ICD-10-CM

## 2020-09-29 DIAGNOSIS — L819 Disorder of pigmentation, unspecified: Secondary | ICD-10-CM | POA: Diagnosis not present

## 2020-09-29 DIAGNOSIS — E7800 Pure hypercholesterolemia, unspecified: Secondary | ICD-10-CM | POA: Diagnosis not present

## 2020-09-29 DIAGNOSIS — I10 Essential (primary) hypertension: Secondary | ICD-10-CM | POA: Diagnosis not present

## 2020-09-29 MED ORDER — TRAMADOL HCL 50 MG PO TABS: 50.0000 mg | ORAL_TABLET | Freq: Three times a day (TID) | ORAL | 0 refills | Status: AC | PRN

## 2020-09-29 NOTE — Patient Instructions (Signed)
Peripheral Vascular Disease  Peripheral vascular disease (PVD) is a disease of the blood vessels that carry blood from the heart to the rest of the body. PVD is also called peripheral artery disease (PAD) or poor circulation. PVD affects most of the body. But it affects the legs and feet the most. PVD can lead to acute limb ischemia. This happens when there is a sudden stop of blood flow to an arm or leg. This is a medical emergency. What are the causes? The most common cause of PVD is a buildup of a fatty substance (plaque) inside your arteries. This decreases blood flow. Plaque can break off and block blood in a smaller artery. This can lead to acute limb ischemia. Other common causes of PVD include:  Blood clots inside the blood vessels.  Injuries to blood vessels.  Irritation and swelling of blood vessels.  Sudden tightening of the blood vessel (spasms). What increases the risk?  A family history of PVD.  Medical conditions, including: ? High cholesterol. ? Diabetes. ? High blood pressure. ? Heart disease. ? Past problems with blood clots. ? Past injury, such as burns or a broken bone.  Other conditions, such as: ? Buerger's disease. This is caused by swollen or irritated blood vessels in your hands and feet. ? Arthritis. ? Birth defects that affect the arteries in your legs. ? Kidney disease.  Using tobacco or nicotine products.  Not getting enough exercise.  Being very overweight (obese).  Being 50 years old or older. What are the signs or symptoms?  Cramps in your butt, legs, and feet.  Pain and weakness in your legs when you are active that goes away when you rest.  Leg pain when at rest.  Leg numbness, tingling, or weakness.  Coldness in a leg or foot, especially when compared with the other leg or foot.  Skin or hair changes. These can include: ? Hair loss. ? Shiny skin. ? Pale or bluish skin. ? Thick toenails.  Being unable to get or keep an  erection.  Tiredness (fatigue).  Weak pulse or no pulse in the feet.  Wounds and sores on the toes, feet, or legs. These take longer to heal. How is this treated? Underlying causes are treated first. Other conditions, like diabetes, high cholesterol, and blood pressure, are also treated. Treatment may include:  Lifestyle changes, such as: ? Quitting smoking. ? Getting regular exercise. ? Having a diet low in fat and cholesterol. ? Not drinking alcohol.  Taking medicines, such as: ? Blood thinners. ? Medicines to improve blood flow. ? Medicines to improve your blood cholesterol.  Procedures to: ? Open the arteries and restore blood flow. ? Insert a small mesh tube (stent) to keep a blocked vessel open. ? Create a new path for blood to flow to the body (peripheral bypass). ? Remove dead tissue from a wound. ? Remove an affected leg or arm. Follow these instructions at home: Medicines  Take over-the-counter and prescription medicines only as told by your doctor.  If you are taking blood thinners: ? Talk with your doctor before you take any medicines that have aspirin, or NSAIDs, such as ibuprofen. ? Take medicines exactly as told. Take them at the same time each day. ? Avoid doing things that could hurt or bruise you. Take action to prevent falls. ? Wear an alert bracelet or carry a card that shows you are taking blood thinners. Lifestyle  Get regular exercise. Ask your doctor about how to stay active.    Talk with your doctor about keeping a healthy weight. If needed, ask about losing weight.  Eat a diet that is low in fat and cholesterol. If you need help, talk with your doctor.  Do not drink alcohol.  Do not smoke or use any products that contain nicotine or tobacco. If you need help quitting, ask your doctor.      General instructions  Take good care of your feet. To do this: ? Wear shoes that fit well and feel good. ? Check your feet often for any cuts or  sores.  Get a flu shot (influenza vaccine) each year.  Keep all follow-up visits. Where to find more information  Society for Vascular Surgery: vascular.org  American Heart Association: heart.org  National Heart, Lung, and Blood Institute: nhlbi.nih.gov Contact a doctor if:  You have cramps in your legs when you walk.  You have leg pain when you rest.  Your leg or foot feels cold.  Your skin changes.  You cannot get or keep an erection.  You have cuts or sores on your legs or feet that do not heal. Get help right away if:  You have sudden changes in the color and feeling of your arms or legs, such as: ? Your arm or leg turns cold, numb, and blue. ? Your arm or leg becomes red, warm, swollen, painful, or numb.  You have any signs of a stroke. "BE FAST" is an easy way to remember the main warning signs: ? B - Balance. Dizziness, sudden trouble walking, or loss of balance. ? E - Eyes. Trouble seeing or a change in how you see. ? F - Face. Sudden weakness or loss of feeling of the face. The face or eyelid may droop on one side. ? A - Arms. Weakness or loss of feeling in an arm. This happens all of a sudden and most often on one side of the body. ? S - Speech. Sudden trouble speaking, slurred speech, or trouble understanding what people say. ? T - Time. Time to call emergency services. Write down what time symptoms started.  You have other signs of a stroke, such as: ? A sudden, very bad headache with no known cause. ? Feeling like you may vomit (nausea). ? Vomiting. ? A seizure.  You have chest pain or trouble breathing. These symptoms may be an emergency. Get help right away. Call your local emergency services (911 in the [place]  Do not wait to see if the symptoms will go away.  Do not drive yourself to the hospital. Summary  Peripheral vascular disease (PVD) is a disease of the blood vessels.  PVD affects the legs and feet the most.  Symptoms may include leg  pain or leg numbness, tingling, and weakness.  Treatment may include lifestyle changes, medicines, and procedures. This information is not intended to replace advice given to you by your health care provider. Make sure you discuss any questions you have with your health care provider. Document Revised: 01/05/2020 Document Reviewed: 01/05/2020 Elsevier Patient Education  2021 Elsevier Inc.  

## 2020-09-29 NOTE — Progress Notes (Signed)
Subjective:    Patient ID: Caroline Francis, female    DOB: 15-May-1951, 70 y.o.   MRN: [id number]  HPI  Pt presents to the clinic today with c/o left leg pain. This started 1 month ago. She describes the pain as achy and throbbing. She noticed that 2 toes on her left foot turn blue intermittently. She denies swelling, bruising, redness, numbness or tingling. She denies abnormal cold sensation. She denies any injury to the area. She has taken Aleve with some relief. She has never smoked and has no history of PAD. She has no history of diabetes, recent A1C 5.4%. She denies low back pain. She does have some intermittent left hip pain but this is not concerning to her.  Review of Systems  Past Medical History:  Diagnosis Date  . GERD (gastroesophageal reflux disease)   . Hyperlipidemia   . Hypertension   . Low TSH level     Current Outpatient Medications  Medication Sig Dispense Refill  . hydrochlorothiazide (HYDRODIURIL) 25 MG tablet Take 1 tablet (25 mg total) by mouth daily. 90 tablet 3  . lansoprazole (PREVACID) 30 MG capsule Take 1 capsule (30 mg total) by mouth 2 (two) times daily. 180 capsule 3  . olmesartan (BENICAR) 40 MG tablet Take 1 tablet (40 mg total) by mouth daily. 90 tablet 3   No current facility-administered medications for this visit.    Allergies  Allergen Reactions  . Lisinopril-Hydrochlorothiazide Cough    REACTION: side effect 03/30/10 Pt said not allergic and no adverse reactions to any meds.    No family history on file.  Social History   Socioeconomic History  . Marital status: Divorced    Spouse name: Not on file  . Number of children: Not on file  . Years of education: Not on file  . Highest education level: Not on file  Occupational History  . Not on file  Tobacco Use  . Smoking status: Never Smoker  . Smokeless tobacco: Never Used  Substance and Sexual Activity  . Alcohol use: Yes    Comment: occasional glass of wine  . Drug use: No  .  Sexual activity: Not on file  Other Topics Concern  . Not on file  Social History Narrative  . Not on file   Social Determinants of Health   Financial Resource Strain: Low Risk   . Difficulty of Paying Living Expenses: Not hard at all  Food Insecurity: No Food Insecurity  . Worried About Charity fundraiser in the Last Year: Never true  . Ran Out of Food in the Last Year: Never true  Transportation Needs: No Transportation Needs  . Lack of Transportation (Medical): No  . Lack of Transportation (Non-Medical): No  Physical Activity: Sufficiently Active  . Days of Exercise per Week: 7 days  . Minutes of Exercise per Session: 30 min  Stress: No Stress Concern Present  . Feeling of Stress : Not at all  Social Connections: Not on file  Intimate Partner Violence: Not At Risk  . Fear of Current or Ex-Partner: No  . Emotionally Abused: No  . Physically Abused: No  . Sexually Abused: No     Constitutional: Denies fever, malaise, fatigue, headache or abrupt weight changes.  Respiratory: Denies difficulty breathing, shortness of breath, cough or sputum production.   Cardiovascular: Denies chest pain, chest tightness, palpitations or swelling in the hands or feet.  Musculoskeletal: Pt reports left leg pain, intermittent left hip pain. Denies difficulty with gait,  muscle pain or joint swelling.  Skin: Pt reports blue discoloration of 2nd/3rd toes left foot. Denies redness, rashes, lesions or ulcercations.  Neurological: Denies numbness, tingling, weakness or problems with balance and coordination.    No other specific complaints in a complete review of systems (except as listed in HPI above).     Objective:   Physical Exam  BP 124/82   Pulse 90   Temp (!) 96.8 F (36 C) (Temporal)   Wt 166 lb (75.3 kg)   SpO2 98%   BMI 26.39 kg/m   Wt Readings from Last 3 Encounters:  07/28/20 165 lb 6 oz (75 kg)  03/03/19 164 lb 4 oz (74.5 kg)  09/19/17 164 lb 8 oz (74.6 kg)    General:  Appears her stated age, well developed, well nourished in NAD. Skin: Toes cool but not cold. Bluish discoloration noted of left foot, more in the 2nd/3rd toes. HEENT: Head: normal shape and size; Eyes: sclera white, EOMs intact;  Cardiovascular: Normal rate and rhythm. S1,S2 noted.  No murmur, rubs or gallops noted. No JVD or BLE edema.Cap refill 4-5 secs left foot. Pedal pulses 2+ bilaterally. Pulmonary/Chest: Normal effort and positive vesicular breath sounds. No respiratory distress. No wheezes, rales or ronchi noted.  Musculoskeletal: Normal abduction, flexion and extension of the left hip. Decreased internal/external rotation and adduction secondary to pain. Pain with palpation of the left groin, but none over the left trochanter or anterior iliac crest. Gait steady, without device. Neurological: Alert and oriented. Sensation intact to LLE.  BMET    Component Value Date/Time   NA 135 07/28/2020 1427   K 3.7 07/28/2020 1427   CL 99 07/28/2020 1427   CO2 27 07/28/2020 1427   GLUCOSE 104 (H) 07/28/2020 1427   BUN 10 07/28/2020 1427   CREATININE 0.67 07/28/2020 1427   CREATININE 0.89 07/23/2015 1632   CALCIUM 9.4 07/28/2020 1427   GFRNONAA 89.55 03/30/2010 1149    Lipid Panel     Component Value Date/Time   CHOL 248 (H) 07/28/2020 1427   TRIG 185.0 (H) 07/28/2020 1427   HDL 78.10 07/28/2020 1427   CHOLHDL 3 07/28/2020 1427   VLDL 37.0 07/28/2020 1427   LDLCALC 133 (H) 07/28/2020 1427    CBC    Component Value Date/Time   WBC 7.4 07/28/2020 1427   RBC 4.51 07/28/2020 1427   HGB 14.0 07/28/2020 1427   HCT 41.9 07/28/2020 1427   PLT 296.0 07/28/2020 1427   MCV 93.0 07/28/2020 1427   MCH 31.5 07/23/2015 1632   MCHC 33.4 07/28/2020 1427   RDW 13.1 07/28/2020 1427   LYMPHSABS 1.8 07/28/2020 1427   MONOABS 0.4 07/28/2020 1427   EOSABS 0.1 07/28/2020 1427   BASOSABS 0.1 07/28/2020 1427    Hgb A1C Lab Results  Component Value Date   HGBA1C 5.4 07/28/2020            Assessment & Plan:   LLE Pain, Blue Discoloration of Toe, HTN, HLD:  Concern for vascular problem but no evidence of PAD RX for Tramadol 50 mg TID prn for pain Referral to vascular placed for further evaluation and treatment  Return precautions discussed  Webb Silversmith, NP This visit occurred during the SARS-CoV-2 public health emergency.  Safety protocols were in place, including screening questions prior to the visit, additional usage of staff PPE, and extensive cleaning of exam room while observing appropriate contact time as indicated for disinfecting solutions.

## 2020-10-05 ENCOUNTER — Other Ambulatory Visit (INDEPENDENT_AMBULATORY_CARE_PROVIDER_SITE_OTHER): Payer: Self-pay | Admitting: Vascular Surgery

## 2020-10-05 ENCOUNTER — Encounter (INDEPENDENT_AMBULATORY_CARE_PROVIDER_SITE_OTHER): Payer: Self-pay | Admitting: Vascular Surgery

## 2020-10-05 ENCOUNTER — Other Ambulatory Visit: Payer: Self-pay

## 2020-10-05 ENCOUNTER — Ambulatory Visit (INDEPENDENT_AMBULATORY_CARE_PROVIDER_SITE_OTHER): Payer: Medicare Other

## 2020-10-05 ENCOUNTER — Ambulatory Visit (INDEPENDENT_AMBULATORY_CARE_PROVIDER_SITE_OTHER): Payer: Medicare Other | Admitting: Vascular Surgery

## 2020-10-05 VITALS — BP 147/93 | HR 72 | Ht 67.0 in | Wt 166.0 lb

## 2020-10-05 DIAGNOSIS — L819 Disorder of pigmentation, unspecified: Secondary | ICD-10-CM | POA: Diagnosis not present

## 2020-10-05 DIAGNOSIS — I10 Essential (primary) hypertension: Secondary | ICD-10-CM | POA: Diagnosis not present

## 2020-10-05 DIAGNOSIS — E7800 Pure hypercholesterolemia, unspecified: Secondary | ICD-10-CM

## 2020-10-05 DIAGNOSIS — R209 Unspecified disturbances of skin sensation: Secondary | ICD-10-CM

## 2020-10-05 DIAGNOSIS — M79604 Pain in right leg: Secondary | ICD-10-CM

## 2020-10-05 DIAGNOSIS — M79672 Pain in left foot: Secondary | ICD-10-CM

## 2020-10-05 DIAGNOSIS — M79605 Pain in left leg: Secondary | ICD-10-CM

## 2020-10-05 DIAGNOSIS — M79609 Pain in unspecified limb: Secondary | ICD-10-CM | POA: Insufficient documentation

## 2020-10-05 NOTE — Progress Notes (Signed)
Patient ID: Caroline Francis, female   DOB: 27-Jun-1951, 70 y.o.   MRN: [id number]  Chief Complaint  Patient presents with  . New Patient (Initial Visit)    Ba[place]. LLE Pain    HPI Caroline Francis is a 70 y.o. female.  I am asked to see the patient by Dr. Glori Bickers for evaluation of a painful left leg in a purple left foot.  This has been going on for several weeks now.  The pain starts up in her thigh and groin area and radiates down the leg.  This is particularly severe at night.  She gets up and moves around and that seems to help.  No right leg symptoms although the foot is somewhat discolored as well.  She does not get a lot of swelling.  No ulceration or infection.  Walking intermittently makes the pain worse.  No clear inciting event or causative factor that started the symptoms.  Noninvasive studies were performed today to evaluate the patient's perfusion.  She had normal triphasic waveforms and ABIs bilaterally consistent with no arterial insufficiency.     Past Medical History:  Diagnosis Date  . GERD (gastroesophageal reflux disease)   . Hyperlipidemia   . Hypertension   . Low TSH level     Past Surgical History:  Procedure Laterality Date  . ABDOMINAL HYSTERECTOMY     partial /bleeding  . NASAL SEPTUM SURGERY        Family History No bleeding disorders, clotting disorders, aneurysms, or autoimmune diseases.   Social History   Tobacco Use  . Smoking status: Never Smoker  . Smokeless tobacco: Never Used  Substance Use Topics  . Alcohol use: Yes    Comment: occasional glass of wine  . Drug use: No    Allergies  Allergen Reactions  . Lisinopril-Hydrochlorothiazide Cough    REACTION: side effect 03/30/10 Pt said not allergic and no adverse reactions to any meds.    Current Outpatient Medications  Medication Sig Dispense Refill  . hydrochlorothiazide (HYDRODIURIL) 25 MG tablet Take 1 tablet (25 mg total) by mouth daily. 90 tablet 3  . lansoprazole (PREVACID)  30 MG capsule Take 1 capsule (30 mg total) by mouth 2 (two) times daily. 180 capsule 3  . olmesartan (BENICAR) 40 MG tablet Take 1 tablet (40 mg total) by mouth daily. 90 tablet 3   No current facility-administered medications for this visit.      REVIEW OF SYSTEMS (Negative unless checked)  Constitutional: [] Weight loss  [] Fever  [] Chills Cardiac: [] Chest pain   [] Chest pressure   [] Palpitations   [] Shortness of breath when laying flat   [] Shortness of breath at rest   [] Shortness of breath with exertion. Vascular:  [] Pain in legs with walking   [x] Pain in legs at rest   [x] Pain in legs when laying flat   [] Claudication   [] Pain in feet when walking  [] Pain in feet at rest  [] Pain in feet when laying flat   [] History of DVT   [] Phlebitis   [] Swelling in legs   [] Varicose veins   [] Non-healing ulcers Pulmonary:   [] Uses home oxygen   [] Productive cough   [] Hemoptysis   [] Wheeze  [] COPD   [] Asthma Neurologic:  [] Dizziness  [] Blackouts   [] Seizures   [] History of stroke   [] History of TIA  [] Aphasia   [] Temporary blindness   [] Dysphagia   [] Weakness or numbness in arms   [x] Weakness or numbness in legs Musculoskeletal:  [x] Arthritis   [] Joint swelling   []   Joint pain   [] Low back pain Hematologic:  [] Easy bruising  [] Easy bleeding   [] Hypercoagulable state   [] Anemic  [] Hepatitis Gastrointestinal:  [] Blood in stool   [] Vomiting blood  [] Gastroesophageal reflux/heartburn   [] Abdominal pain Genitourinary:  [] Chronic kidney disease   [] Difficult urination  [] Frequent urination  [] Burning with urination   [] Hematuria Skin:  [] Rashes   [] Ulcers   [] Wounds Psychological:  [] History of anxiety   []  History of major depression.    Physical Exam BP (!) 147/93   Pulse 72   Ht 5\' 7"  (1.702 m)   Wt 166 lb (75.3 kg)   BMI 26.00 kg/m  Gen:  WD/WN, NAD Head: [place]/AT, No temporalis wasting.  Ear/Nose/Throat: Hearing grossly intact, nares w/o erythema or drainage, oropharynx w/o Erythema/Exudate Eyes:  Conjunctiva clear, sclera non-icteric  Neck: trachea midline.  No JVD.  Pulmonary:  Good air movement, respirations not labored, no use of accessory muscles  Cardiac: RRR, no JVD Vascular:  Vessel Right Left  Radial Palpable Palpable                          DP 2+ 2+  PT 2+ NP   Gastrointestinal:. No masses, surgical incisions, or scars. Musculoskeletal: M/S 5/5 throughout.  Right foot is mildly purple with somewhat sluggish capillary refill.  Left foot is purple and the capillary refill is quite sluggish.  Trace lower extremity swelling.  No open ulcerations Neurologic: Sensation grossly intact in extremities.  Symmetrical.  Speech is fluent. Motor exam as listed above. Psychiatric: Judgment intact, Mood & affect appropriate for pt's clinical situation. Dermatologic: No rashes or ulcers noted.  No cellulitis or open wounds.    Radiology No results found.  Labs Recent Results (from the past 2160 hour(s))  CBC with Differential/Platelet     Status: None   Collection Time: 07/28/20  2:27 PM  Result Value Ref Range   WBC 7.4 4.0 - 10.5 K/uL   RBC 4.51 3.87 - 5.11 Mil/uL   Hemoglobin 14.0 12.0 - 15.0 g/dL   HCT 41.9 36.0 - 46.0 %   MCV 93.0 78.0 - 100.0 fl   MCHC 33.4 30.0 - 36.0 g/dL   RDW 13.1 11.5 - 15.5 %   Platelets 296.0 150.0 - 400.0 K/uL   Neutrophils Relative % 67.0 43.0 - 77.0 %   Lymphocytes Relative 24.4 12.0 - 46.0 %   Monocytes Relative 5.3 3.0 - 12.0 %   Eosinophils Relative 2.0 0.0 - 5.0 %   Basophils Relative 1.3 0.0 - 3.0 %   Neutro Abs 5.0 1.4 - 7.7 K/uL   Lymphs Abs 1.8 0.7 - 4.0 K/uL   Monocytes Absolute 0.4 0.1 - 1.0 K/uL   Eosinophils Absolute 0.1 0.0 - 0.7 K/uL   Basophils Absolute 0.1 0.0 - 0.1 K/uL  Comprehensive metabolic panel     Status: Abnormal   Collection Time: 07/28/20  2:27 PM  Result Value Ref Range   Sodium 135 135 - 145 mEq/L   Potassium 3.7 3.5 - 5.1 mEq/L   Chloride 99 96 - 112 mEq/L   CO2 27 19 - 32 mEq/L   Glucose, Bld 104  (H) 70 - 99 mg/dL   BUN 10 6 - 23 mg/dL   Creatinine, Ser 0.67 0.40 - 1.20 mg/dL   Total Bilirubin 0.6 0.2 - 1.2 mg/dL   Alkaline Phosphatase 67 39 - 117 U/L   AST 16 0 - 37 U/L   ALT 13 0 -  35 U/L   Total Protein 6.3 6.0 - 8.3 g/dL   Albumin 4.3 3.5 - 5.2 g/dL   GFR 89.22 >60.00 mL/min    Comment: Calculated using the CKD-EPI Creatinine Equation (2021)   Calcium 9.4 8.4 - 10.5 mg/dL  Hemoglobin A1c     Status: None   Collection Time: 07/28/20  2:27 PM  Result Value Ref Range   Hgb A1c MFr Bld 5.4 4.6 - 6.5 %    Comment: Glycemic Control Guidelines for People with Diabetes:Non Diabetic:  <6%Goal of Therapy: <7%Additional Action Suggested:  >8%   Lipid panel     Status: Abnormal   Collection Time: 07/28/20  2:27 PM  Result Value Ref Range   Cholesterol 248 (H) 0 - 200 mg/dL    Comment: ATP III Classification       Desirable:  < 200 mg/dL               Borderline High:  200 - 239 mg/dL          High:  > = 240 mg/dL   Triglycerides 185.0 (H) 0.0 - 149.0 mg/dL    Comment: Normal:  <150 mg/dLBorderline High:  150 - 199 mg/dL   HDL 78.10 >39.00 mg/dL   VLDL 37.0 0.0 - 40.0 mg/dL   LDL Cholesterol 133 (H) 0 - 99 mg/dL   Total CHOL/HDL Ratio 3     Comment:                Men          Women1/2 Average Risk     3.4          3.3Average Risk          5.0          4.42X Average Risk          9.6          7.13X Average Risk          15.0          11.0                       NonHDL 170.39     Comment: NOTE:  Non-HDL goal should be 30 mg/dL higher than patient's LDL goal (i.e. LDL goal of < 70 mg/dL, would have non-HDL goal of < 100 mg/dL)  TSH     Status: None   Collection Time: 07/28/20  2:27 PM  Result Value Ref Range   TSH 1.19 0.35 - 4.50 uIU/mL  VITAMIN D 25 Hydroxy (Vit-D Deficiency, Fractures)     Status: Abnormal   Collection Time: 07/28/20  2:27 PM  Result Value Ref Range   VITD 16.60 (L) 30.00 - 100.00 ng/mL  Vitamin B12     Status: Abnormal   Collection Time: 07/28/20  2:27 PM   Result Value Ref Range   Vitamin B-12 167 (L) 211 - 911 pg/mL    Assessment/Plan:  Essential hypertension blood pressure control important in reducing the progression of atherosclerotic disease. On appropriate oral medications.   Hyperlipidemia lipid control important in reducing the progression of atherosclerotic disease. Continue statin therapy   Pain in limb Noninvasive studies were performed today to evaluate the patient's perfusion.  She had normal triphasic waveforms and ABIs bilaterally consistent with no arterial insufficiency.  Given the appearance of the foot, ischemia was my most likely diagnosis but that does not appear to be the case.  There may be some component  of venous congestion, but I think her biggest cause of pain is neuropathic pain likely from sciatic nerve irritation.  It appears that laying flat seems to precipitate this.  I will defer further work-up to her primary care physician at this point.  She will see [place] on an as-needed basis.      Leotis Pain 10/05/2020, 1:11 PM   This note was created with Dragon medical transcription system.  Any errors from dictation are unintentional.

## 2020-10-05 NOTE — Assessment & Plan Note (Signed)
Noninvasive studies were performed today to evaluate the patient's perfusion.  She had normal triphasic waveforms and ABIs bilaterally consistent with no arterial insufficiency.  Given the appearance of the foot, ischemia was my most likely diagnosis but that does not appear to be the case.  There may be some component of venous congestion, but I think her biggest cause of pain is neuropathic pain likely from sciatic nerve irritation.  It appears that laying flat seems to precipitate this.  I will defer further work-up to her primary care physician at this point.  She will see [place] on an as-needed basis.

## 2020-10-05 NOTE — Assessment & Plan Note (Signed)
blood pressure control important in reducing the progression of atherosclerotic disease. On appropriate oral medications.  

## 2020-10-05 NOTE — Assessment & Plan Note (Signed)
lipid control important in reducing the progression of atherosclerotic disease. Continue statin therapy  

## 2020-10-06 ENCOUNTER — Ambulatory Visit (INDEPENDENT_AMBULATORY_CARE_PROVIDER_SITE_OTHER): Payer: Medicare Other

## 2020-10-06 DIAGNOSIS — E538 Deficiency of other specified B group vitamins: Secondary | ICD-10-CM

## 2020-10-06 MED ORDER — CYANOCOBALAMIN 1000 MCG/ML IJ SOLN
1000.0000 ug | Freq: Once | INTRAMUSCULAR | Status: AC
  Administered 2020-10-06: 1000 ug via INTRAMUSCULAR

## 2020-10-06 NOTE — Progress Notes (Addendum)
Per orders of Dr. Danise Mina, in Dr. Marliss Coots absence, monthly injection of B12 given by Loreen Freud. Patient tolerated injection well.

## 2020-10-11 ENCOUNTER — Telehealth: Payer: Self-pay | Admitting: Family Medicine

## 2020-10-11 NOTE — Telephone Encounter (Signed)
Please schedule an office appt

## 2020-10-11 NOTE — Telephone Encounter (Signed)
Patient scheduled office visit with Dr.Tower on 10/13/20.

## 2020-10-11 NOTE — Telephone Encounter (Signed)
Caroline Francis called in due to she had a doppler done on her left leg and it came back normal but she is still having bad leg pain and wanted to know what to do next for the pain.  Please advise

## 2020-10-11 NOTE — Telephone Encounter (Signed)
Will route to PCP for review. 

## 2020-10-13 ENCOUNTER — Encounter: Payer: Self-pay | Admitting: Family Medicine

## 2020-10-13 ENCOUNTER — Other Ambulatory Visit: Payer: Self-pay

## 2020-10-13 ENCOUNTER — Ambulatory Visit (INDEPENDENT_AMBULATORY_CARE_PROVIDER_SITE_OTHER): Payer: Medicare Other | Admitting: Family Medicine

## 2020-10-13 ENCOUNTER — Ambulatory Visit (INDEPENDENT_AMBULATORY_CARE_PROVIDER_SITE_OTHER)
Admission: RE | Admit: 2020-10-13 | Discharge: 2020-10-13 | Disposition: A | Discharge status: Home / Self Care | Payer: Medicare Other | Source: Ambulatory Visit | Attending: Family Medicine | Admitting: Family Medicine

## 2020-10-13 VITALS — BP 130/82 | HR 73 | Temp 96.9°F | Ht 67.0 in | Wt 163.4 lb

## 2020-10-13 DIAGNOSIS — R1032 Left lower quadrant pain: Secondary | ICD-10-CM | POA: Insufficient documentation

## 2020-10-13 DIAGNOSIS — M79605 Pain in left leg: Secondary | ICD-10-CM | POA: Diagnosis not present

## 2020-10-13 DIAGNOSIS — I878 Other specified disorders of veins: Secondary | ICD-10-CM | POA: Diagnosis not present

## 2020-10-13 DIAGNOSIS — M79604 Pain in right leg: Secondary | ICD-10-CM | POA: Diagnosis not present

## 2020-10-13 DIAGNOSIS — M1612 Unilateral primary osteoarthritis, left hip: Secondary | ICD-10-CM | POA: Diagnosis not present

## 2020-10-13 DIAGNOSIS — M545 Low back pain, unspecified: Secondary | ICD-10-CM | POA: Diagnosis not present

## 2020-10-13 NOTE — Progress Notes (Signed)
Subjective:    Patient ID: Caroline Francis, female    DOB: [birth year], 69 y.o.   MRN: [id number]  This visit occurred during the SARS-CoV-2 public health emergency.  Safety protocols were in place, including screening questions prior to the visit, additional usage of staff PPE, and extensive cleaning of exam room while observing appropriate contact time as indicated for disinfecting solutions.    HPI Pt presents for c/o leg pain   Wt Readings from Last 3 Encounters:  10/13/20 163 lb 6 oz (74.1 kg)  10/05/20 166 lb (75.3 kg)  09/29/20 166 lb (75.3 kg)   25.59 kg/m  painfu left leg and purple foot  Wakes her up at night with cramps (like a charlie horse)  Every night - several times  Sharp pain in between cramps comes and goes (in the calf) and occ starts in lateral thigh and radiates down  No h/o raynauds   Takes tumeric  Takes mvi and B12    occ tingling in foot   She has had trouble with her L hip (saw ortho Dr Lisette Grinder) Did PT there  No arthritis in hip  ? Bursitis  occ low back and buttock bother her   Sometimes hurts to walk/put weight on the leg   Has never worn supp stockings   She saw Dr Lucky Cowboy (vascular specialist) and noted that ABIs and triphasic waveforms are normal  ? About some possible venous congestion  Also ? If her pain is sue to sciatic nerve irritation (it worsens with lying flat)   Patient Active Problem List   Diagnosis Date Noted  . Left leg pain 10/13/2020  . Left groin pain 10/13/2020  . Pain in limb 10/05/2020  . Vitamin B12 deficiency 07/29/2020  . Vitamin D deficiency 07/29/2020  . Current use of proton pump inhibitor 07/28/2020  . Screening mammogram, encounter for 03/12/2019  . Medicare annual wellness visit, initial 03/03/2019  . Osteopenia 11/04/2017  . Welcome to Medicare preventive visit 09/19/2017  . Estrogen deficiency 09/19/2017  . Elevated glucose level 09/19/2017  . Osteoarthritis of hip 07/26/2015  . Pain in left hip  07/23/2015  . External hemorrhoids 04/09/2012  . Routine general medical examination at a health care facility 04/02/2012  . SNORING, HX OF 05/04/2010  . Hyperthyroidism 03/31/2010  . Hyperlipidemia 12/25/2007  . Essential hypertension 12/25/2007  . GERD 12/25/2007  . HIATAL HERNIA 12/25/2007   Past Medical History:  Diagnosis Date  . GERD (gastroesophageal reflux disease)   . Hyperlipidemia   . Hypertension   . Low TSH level    Past Surgical History:  Procedure Laterality Date  . ABDOMINAL HYSTERECTOMY     partial /bleeding  . NASAL SEPTUM SURGERY     Social History   Tobacco Use  . Smoking status: Never Smoker  . Smokeless tobacco: Never Used  Substance Use Topics  . Alcohol use: Yes    Comment: occasional glass of wine  . Drug use: No   History reviewed. No pertinent family history. Allergies  Allergen Reactions  . Lisinopril-Hydrochlorothiazide Cough    REACTION: side effect 03/30/10 Pt said not allergic and no adverse reactions to any meds.   Current Outpatient Medications on File Prior to Visit  Medication Sig Dispense Refill  . hydrochlorothiazide (HYDRODIURIL) 25 MG tablet Take 1 tablet (25 mg total) by mouth daily. 90 tablet 3  . lansoprazole (PREVACID) 30 MG capsule Take 1 capsule (30 mg total) by mouth 2 (two) times daily. 180 capsule 3  .  olmesartan (BENICAR) 40 MG tablet Take 1 tablet (40 mg total) by mouth daily. 90 tablet 3   No current facility-administered medications on file prior to visit.     Review of Systems  Constitutional: Negative for activity change, appetite change, fatigue, fever and unexpected weight change.  HENT: Negative for congestion, ear pain, rhinorrhea, sinus pressure and sore throat.   Eyes: Negative for pain, redness and visual disturbance.  Respiratory: Negative for cough, shortness of breath and wheezing.   Cardiovascular: Negative for chest pain and palpitations.  Gastrointestinal: Negative for abdominal pain, blood in  stool, constipation and diarrhea.  Endocrine: Negative for polydipsia and polyuria.  Genitourinary: Negative for dysuria, frequency and urgency.  Musculoskeletal: Positive for back pain and gait problem. Negative for arthralgias, joint swelling and myalgias.  Skin: Negative for pallor and rash.  Allergic/Immunologic: Negative for environmental allergies.  Neurological: Negative for dizziness, syncope and headaches.  Hematological: Negative for adenopathy. Does not bruise/bleed easily.  Psychiatric/Behavioral: Negative for decreased concentration and dysphoric mood. The patient is not nervous/anxious.        Objective:   Physical Exam Constitutional:      General: She is not in acute distress.    Appearance: Normal appearance. She is normal weight. She is not ill-appearing.  HENT:     Head: Normocephalic and atraumatic.  Eyes:     General: No scleral icterus.    Conjunctiva/sclera: Conjunctivae normal.     Pupils: Pupils are equal, round, and reactive to light.  Cardiovascular:     Rate and Rhythm: Normal rate and regular rhythm.     Pulses: Normal pulses.     Heart sounds: Normal heart sounds.  Pulmonary:     Effort: Pulmonary effort is normal. No respiratory distress.     Breath sounds: Normal breath sounds. No wheezing.     Comments: No crackles Abdominal:     General: Abdomen is flat. Bowel sounds are normal. There is no distension.     Palpations: There is no mass.     Tenderness: There is no abdominal tenderness.  Musculoskeletal:     Cervical back: Normal range of motion and neck supple.     Left lower leg: No edema.     Comments: No pain currently  Some prominent veins in both lower legs Feet are cool and seem well perfused  L foot 2nd toe has darker color(no tenderness No spinal rom Pain with L hip ext rotation  Some L trochanteric tenderness No calf tenderness or swelling or palpable cords  Neg homan's sign bilat  Lymphadenopathy:     Cervical: No cervical  adenopathy.  Skin:    Coloration: Skin is not pale.     Findings: No erythema or rash.  Neurological:     Mental Status: She is alert.     Sensory: No sensory deficit.     Motor: No weakness.     Coordination: Coordination normal.     Deep Tendon Reflexes: Reflexes normal.  Psychiatric:        Mood and Affect: Mood normal.           Assessment & Plan:   Problem List Items Addressed This Visit      Other   Pain in limb   Left leg pain - Primary    Sharp and intermittent  S/p neg vascular w/u with Dr Lucky Cowboy (reviewed today) Also some night cramps of L calf  Reassuring vascular eval recently (occ 2, 4h toes look blue) -suggested supp hose  for venous insufficiency  Disc poss of hip bursitis/ OA or radicular pain from LS xrays ordered  Adv trial of magnesium for cramps if tolerated Will continue tumeric      Relevant Orders   DG Lumbar Spine Complete   DG Hip Unilat W OR W/O Pelvis 2-3 Views Left   Left groin pain    In combination with L leg pain  Worse with int rota of L hip  Xray today  Has seen Dr Tanja Port in the past for orthopedics

## 2020-10-13 NOTE — Patient Instructions (Signed)
For leg cramps try adding magnesium over the counter 500 mg  If it causes diarrhea -stop it  Make sure to take vitamin D at least 2000 iu daily  Tumeric is good- keep taking that   Stretch and exercise at tolerated   Get some support socks to the knee and wear them during the day   Let's get xray of your lumbar spine and hip today

## 2020-10-13 NOTE — Assessment & Plan Note (Addendum)
Sharp and intermittent  S/p neg vascular w/u with Dr Lucky Cowboy (reviewed today) Also some night cramps of L calf  Reassuring vascular eval recently (occ 2, 4h toes look blue) -suggested supp hose for venous insufficiency  Disc poss of hip bursitis/ OA or radicular pain from LS xrays ordered  Adv trial of magnesium for cramps if tolerated Will continue tumeric

## 2020-10-13 NOTE — Assessment & Plan Note (Signed)
In combination with L leg pain  Worse with int rota of L hip  Xray today  Has seen Dr Tanja Port in the past for orthopedics

## 2020-10-14 ENCOUNTER — Telehealth: Payer: Self-pay | Admitting: *Deleted

## 2020-10-14 DIAGNOSIS — M79605 Pain in left leg: Secondary | ICD-10-CM

## 2020-10-14 DIAGNOSIS — R1032 Left lower quadrant pain: Secondary | ICD-10-CM

## 2020-10-14 NOTE — Telephone Encounter (Signed)
Pt notified of xray results and Dr. Marliss Coots comments. Pt said she would like [place] to put a referral in to get her back to see Dr Lisette Grinder, I advise PCP will put referral in and our [facility] will call to schedule appt

## 2020-10-14 NOTE — Telephone Encounter (Signed)
Left VM requesting pt to call the office back 

## 2020-10-14 NOTE — Telephone Encounter (Signed)
-----   Message from Abner Greenspan, MD sent at 10/14/2020 11:01 AM EDT ----- Hip arthritis noted (also degenerative disc dz on spine film) Either of these problems can cause leg pain  I would like her to see orthopedics She saw Dr Lisette Grinder in the past - does she want to return there?  Need a referral?

## 2020-10-14 NOTE — Telephone Encounter (Signed)
Referral done

## 2020-10-22 ENCOUNTER — Other Ambulatory Visit: Payer: Self-pay | Admitting: Family Medicine

## 2020-11-08 DIAGNOSIS — M5416 Radiculopathy, lumbar region: Secondary | ICD-10-CM | POA: Diagnosis not present

## 2020-11-08 DIAGNOSIS — M25559 Pain in unspecified hip: Secondary | ICD-10-CM | POA: Diagnosis not present

## 2020-11-09 ENCOUNTER — Ambulatory Visit (INDEPENDENT_AMBULATORY_CARE_PROVIDER_SITE_OTHER): Payer: Medicare Other

## 2020-11-09 ENCOUNTER — Other Ambulatory Visit: Payer: Self-pay

## 2020-11-09 DIAGNOSIS — E538 Deficiency of other specified B group vitamins: Secondary | ICD-10-CM

## 2020-11-09 MED ORDER — CYANOCOBALAMIN 1000 MCG/ML IJ SOLN
1000.0000 ug | Freq: Once | INTRAMUSCULAR | Status: AC
  Administered 2020-11-09: 1000 ug via INTRAMUSCULAR

## 2020-11-09 NOTE — Progress Notes (Signed)
Per orders of Dr. Loura Pardon, injection of B12 given Left deltoid by Lurlean Nanny.  Patient tolerated injection well.

## 2020-11-23 DIAGNOSIS — M25552 Pain in left hip: Secondary | ICD-10-CM | POA: Diagnosis not present

## 2020-11-23 DIAGNOSIS — M5416 Radiculopathy, lumbar region: Secondary | ICD-10-CM | POA: Diagnosis not present

## 2020-11-24 ENCOUNTER — Other Ambulatory Visit: Payer: Medicare Other

## 2020-11-24 ENCOUNTER — Ambulatory Visit
Admission: RE | Admit: 2020-11-24 | Discharge: 2020-11-24 | Disposition: A | Discharge status: Home / Self Care | Payer: Federal, State, Local not specified - PPO | Source: Ambulatory Visit | Attending: Family Medicine | Admitting: Family Medicine

## 2020-11-24 ENCOUNTER — Other Ambulatory Visit: Payer: Self-pay

## 2020-11-24 DIAGNOSIS — E2839 Other primary ovarian failure: Secondary | ICD-10-CM | POA: Diagnosis present

## 2020-11-24 DIAGNOSIS — M85851 Other specified disorders of bone density and structure, right thigh: Secondary | ICD-10-CM | POA: Diagnosis not present

## 2020-11-24 DIAGNOSIS — Z1231 Encounter for screening mammogram for malignant neoplasm of breast: Secondary | ICD-10-CM | POA: Insufficient documentation

## 2020-11-24 DIAGNOSIS — M85832 Other specified disorders of bone density and structure, left forearm: Secondary | ICD-10-CM | POA: Diagnosis not present

## 2020-11-26 ENCOUNTER — Encounter: Payer: Self-pay | Admitting: *Deleted

## 2020-12-14 ENCOUNTER — Ambulatory Visit (INDEPENDENT_AMBULATORY_CARE_PROVIDER_SITE_OTHER): Payer: Medicare Other | Admitting: *Deleted

## 2020-12-14 ENCOUNTER — Other Ambulatory Visit: Payer: Self-pay

## 2020-12-14 DIAGNOSIS — E538 Deficiency of other specified B group vitamins: Secondary | ICD-10-CM

## 2020-12-14 MED ORDER — CYANOCOBALAMIN 1000 MCG/ML IJ SOLN
1000.0000 ug | Freq: Once | INTRAMUSCULAR | Status: AC
  Administered 2020-12-14: 1000 ug via INTRAMUSCULAR

## 2020-12-14 NOTE — Progress Notes (Signed)
Per orders of Dr. Loura Pardon, injection of B12 given Right deltoid by Varney Daily, CMA   Patient tolerated injection well.

## 2020-12-15 ENCOUNTER — Telehealth: Payer: Self-pay

## 2020-12-15 DIAGNOSIS — R202 Paresthesia of skin: Secondary | ICD-10-CM

## 2020-12-15 NOTE — Telephone Encounter (Signed)
She has already had nl vascular w/u.  I would ref to neurology -does she have a pref re: area?

## 2020-12-15 NOTE — Telephone Encounter (Signed)
Pt notified of Dr. Marliss Coots comments. Pt agrees with referral. She would like to see someone in [place] if possible

## 2020-12-15 NOTE — Telephone Encounter (Signed)
Pt left v/m that she request referral due to lt toes turning blue and lt lower leg has numbness. I spoke with pt; all toes on lt foot turn blue on and off; pt is unaware of trigger; pt is doing physical therapy with Dr Lorenso Courier. Dr Lorenso Courier did not know why toes were turning blue.  Lower half of lt leg numbness comes and goes randomly. Pt has dull pain in lt lower leg and foot and correlates with the numbness. No pain right now. Pt wearing compression socks and no blueness in toes now. Pt was seen  On 10/13/20. Since PT the groin area is not hurting. Pt request referral for numbness in lower lt leg and L toes turning blue.sending note to Dr Glori Bickers.

## 2020-12-16 DIAGNOSIS — R202 Paresthesia of skin: Secondary | ICD-10-CM | POA: Insufficient documentation

## 2020-12-16 NOTE — Telephone Encounter (Signed)
Referral is done

## 2021-01-18 ENCOUNTER — Other Ambulatory Visit: Payer: Self-pay

## 2021-01-18 ENCOUNTER — Other Ambulatory Visit (INDEPENDENT_AMBULATORY_CARE_PROVIDER_SITE_OTHER): Payer: Federal, State, Local not specified - PPO

## 2021-01-18 ENCOUNTER — Ambulatory Visit (INDEPENDENT_AMBULATORY_CARE_PROVIDER_SITE_OTHER): Payer: Federal, State, Local not specified - PPO

## 2021-01-18 DIAGNOSIS — E538 Deficiency of other specified B group vitamins: Secondary | ICD-10-CM

## 2021-01-18 MED ORDER — CYANOCOBALAMIN 1000 MCG/ML IJ SOLN
1000.0000 ug | Freq: Once | INTRAMUSCULAR | Status: AC
  Administered 2021-01-18: 1000 ug via INTRAMUSCULAR

## 2021-01-18 NOTE — Progress Notes (Addendum)
Per orders of Dr. Glori Bickers, monthly injection of B12 1000 mcg/ml given by Pilar Grammes, CMA in Left Deltoid.  Patient tolerated injection well.  Pt had blood draw for B12 prior to injection.

## 2021-01-19 LAB — VITAMIN B12: Vitamin B-12: 641 pg/mL (ref 211–911)

## 2021-01-20 ENCOUNTER — Encounter: Payer: Self-pay | Admitting: *Deleted

## 2021-02-08 ENCOUNTER — Other Ambulatory Visit: Payer: Self-pay

## 2021-02-08 ENCOUNTER — Ambulatory Visit: Payer: Federal, State, Local not specified - PPO

## 2021-03-01 ENCOUNTER — Other Ambulatory Visit: Payer: Self-pay

## 2021-03-01 ENCOUNTER — Ambulatory Visit (INDEPENDENT_AMBULATORY_CARE_PROVIDER_SITE_OTHER): Payer: Federal, State, Local not specified - PPO

## 2021-03-01 DIAGNOSIS — E538 Deficiency of other specified B group vitamins: Secondary | ICD-10-CM | POA: Diagnosis not present

## 2021-03-01 MED ORDER — CYANOCOBALAMIN 1000 MCG/ML IJ SOLN
1000.0000 ug | Freq: Once | INTRAMUSCULAR | Status: AC
  Administered 2021-03-01: 1000 ug via INTRAMUSCULAR

## 2021-03-01 NOTE — Progress Notes (Signed)
Per orders of Dr. Glori Bickers, injection of monthly B12 1000 mcg/ml injection given by Pilar Grammes, CMA in Right Deltoid.  Patient tolerated injection well.

## 2021-03-04 ENCOUNTER — Other Ambulatory Visit: Payer: Self-pay | Admitting: Student

## 2021-03-04 DIAGNOSIS — M7581 Other shoulder lesions, right shoulder: Secondary | ICD-10-CM

## 2021-03-04 DIAGNOSIS — M7521 Bicipital tendinitis, right shoulder: Secondary | ICD-10-CM

## 2021-03-04 DIAGNOSIS — M75101 Unspecified rotator cuff tear or rupture of right shoulder, not specified as traumatic: Secondary | ICD-10-CM

## 2021-03-17 ENCOUNTER — Ambulatory Visit
Admission: RE | Admit: 2021-03-17 | Discharge: 2021-03-17 | Disposition: A | Discharge status: Home / Self Care | Payer: Federal, State, Local not specified - PPO | Source: Ambulatory Visit | Attending: Student | Admitting: Student

## 2021-03-17 ENCOUNTER — Other Ambulatory Visit: Payer: Self-pay

## 2021-03-17 ENCOUNTER — Ambulatory Visit: Payer: Federal, State, Local not specified - PPO

## 2021-03-17 DIAGNOSIS — M7581 Other shoulder lesions, right shoulder: Secondary | ICD-10-CM | POA: Diagnosis present

## 2021-03-17 DIAGNOSIS — M75101 Unspecified rotator cuff tear or rupture of right shoulder, not specified as traumatic: Secondary | ICD-10-CM | POA: Diagnosis not present

## 2021-03-17 DIAGNOSIS — M7521 Bicipital tendinitis, right shoulder: Secondary | ICD-10-CM | POA: Insufficient documentation

## 2021-03-25 DIAGNOSIS — M12811 Other specific arthropathies, not elsewhere classified, right shoulder: Secondary | ICD-10-CM | POA: Insufficient documentation

## 2021-04-05 ENCOUNTER — Ambulatory Visit (INDEPENDENT_AMBULATORY_CARE_PROVIDER_SITE_OTHER): Payer: Federal, State, Local not specified - PPO

## 2021-04-05 ENCOUNTER — Other Ambulatory Visit: Payer: Self-pay

## 2021-04-05 ENCOUNTER — Telehealth: Payer: Self-pay | Admitting: Family Medicine

## 2021-04-05 DIAGNOSIS — E538 Deficiency of other specified B group vitamins: Secondary | ICD-10-CM

## 2021-04-05 MED ORDER — CYANOCOBALAMIN 1000 MCG/ML IJ SOLN
1000.0000 ug | Freq: Once | INTRAMUSCULAR | Status: AC
Start: 2021-04-05 — End: 2021-04-05
  Administered 2021-04-05: 1000 ug via INTRAMUSCULAR

## 2021-04-05 NOTE — Progress Notes (Addendum)
Per orders of Dr. Glori Bickers, injection of monthly B12 1076mcg/ml given by Pilar Grammes, CMA in the Left Deltoid. Patient tolerated injection well.  Pt scheduled next appt at checkout

## 2021-04-05 NOTE — Telephone Encounter (Signed)
  Encourage patient to contact the pharmacy for refills or they can request refills through [place]:  Please schedule appointment if longer than 1 year  NEXT APPOINTMENT DATE:  MEDICATION:olmesartan (BENICAR) 40 MG tablet hydrochlorothiazide (HYDRODIURIL) 25 MG tablet  Is the patient out of medication?   PHARMACY:[id number] [address], [place] - [phone]   Let patient know to contact pharmacy at the end of the day to make sure medication is ready.  Please notify patient to allow 48-72 hours to process  CLINICAL FILLS OUT ALL BELOW:   LAST REFILL:  QTY:  REFILL DATE:    OTHER COMMENTS:    Okay for refill?  Please advise

## 2021-04-06 MED ORDER — HYDROCHLOROTHIAZIDE 25 MG PO TABS: 25.0000 mg | ORAL_TABLET | Freq: Every day | ORAL | 1 refills | Status: DC

## 2021-04-06 MED ORDER — OLMESARTAN MEDOXOMIL 40 MG PO TABS: 40.0000 mg | ORAL_TABLET | Freq: Every day | ORAL | 1 refills | Status: DC

## 2021-04-26 ENCOUNTER — Other Ambulatory Visit: Payer: Self-pay | Admitting: Surgery

## 2021-05-04 ENCOUNTER — Other Ambulatory Visit
Admission: RE | Admit: 2021-05-04 | Discharge: 2021-05-04 | Disposition: A | Discharge status: Home / Self Care | Payer: Federal, State, Local not specified - PPO | Source: Ambulatory Visit | Attending: Surgery | Admitting: Surgery

## 2021-05-04 NOTE — Patient Instructions (Addendum)
Your procedure is scheduled on:05-10-21 Tuesday Report to the Registration Desk on the 1st floor of the [facility].Then proceed to the 2nd floor Surgery Desk in the [facility] To find out your arrival time, please call [phone] between 1PM - 3PM on:05-09-21 Wednesday  REMEMBER: Instructions that are not followed completely may result in serious medical risk, up to and including death; or upon the discretion of your surgeon and anesthesiologist your surgery may need to be rescheduled.  Do not eat food after midnight the night before surgery.  No gum chewing, lozengers or hard candies.  You may however, drink CLEAR liquids up to 2 hours before you are scheduled to arrive for your surgery. Do not drink anything within 2 hours of your scheduled arrival time.  Clear liquids include: - water  - apple juice without pulp - gatorade (not RED, PURPLE, OR BLUE) - black coffee or tea (Do NOT add milk or creamers to the coffee or tea) Do NOT drink anything that is not on this list.  Type 1 and Type 2 diabetics should only drink water.  In addition, your doctor has ordered for you to drink the provided  Ensure Pre-Surgery Clear Carbohydrate Drink  Gatorade G2 Drinking this carbohydrate drink up to two hours before surgery helps to reduce insulin resistance and improve patient outcomes. Please complete drinking 2 hours prior to scheduled arrival time.  TAKE THESE MEDICATIONS THE MORNING OF SURGERY WITH A SIP OF WATER: -lansoprazole (PREVACID) 30 MG capsule  One week prior to surgery: Stop Anti-inflammatories (NSAIDS) such as Advil, Aleve, Ibuprofen, Motrin, Naproxen, Naprosyn and Aspirin based products such as Excedrin, Goodys Powder, BC Powder.You may however, take Tylenol if needed for pain up until the day of surgery. You may continue celecoxib (CELEBREX) 200 MG capsule up until the day prior to surgery  Stop ANY OVER THE COUNTER supplements/vitamins NOW (05-09-21) until after surgery  (Omega-3 Fatty Acids (FISH OIL) 500 MG CAPS, TURMERIC CURCUMIN PO and vitamin B-12 (CYANOCOBALAMIN) 1000 MCG tablet)  No Alcohol for 24 hours before or after surgery.  No Smoking including e-cigarettes for 24 hours prior to surgery.  No chewable tobacco products for at least 6 hours prior to surgery.  No nicotine patches on the day of surgery.  Do not use any "recreational" drugs for at least a week prior to your surgery.  Please be advised that the combination of cocaine and anesthesia may have negative outcomes, up to and including death. If you test positive for cocaine, your surgery will be cancelled.  On the morning of surgery brush your teeth with toothpaste and water, you may rinse your mouth with mouthwash if you wish. Do not swallow any toothpaste or mouthwash.  Use CHG Soap as directed on instruction sheet.  Do not wear jewelry, make-up, hairpins, clips or nail polish.  Do not wear lotions, powders, or perfumes.   Do not shave body from the neck down 48 hours prior to surgery just in case you cut yourself which could leave a site for infection.  Also, freshly shaved skin may become irritated if using the CHG soap.  Contact lenses, hearing aids and dentures may not be worn into surgery.  Do not bring valuables to the hospital. [facility] is not responsible for any missing/lost belongings or valuables.   Total Shoulder Arthroplasty:  use Benzolyl Peroxide 5% Gel as directed on instruction sheet.  Notify your doctor if there is any change in your medical condition (cold, fever, infection).  Wear  comfortable clothing (specific to your surgery type) to the hospital.  After surgery, you can help prevent lung complications by doing breathing exercises.  Take deep breaths and cough every 1-2 hours. Your doctor may order a device called an Incentive Spirometer to help you take deep breaths. When coughing or sneezing, hold a pillow firmly against your incision with both hands.  This is called "splinting." Doing this helps protect your incision. It also decreases belly discomfort.  If you are being admitted to the hospital overnight, leave your suitcase in the car. After surgery it may be brought to your room.  If you are being discharged the day of surgery, you will not be allowed to drive home. You will need a responsible adult (18 years or older) to drive you home and stay with you that night.   If you are taking public transportation, you will need to have a responsible adult (18 years or older) with you. Please confirm with your physician that it is acceptable to use public transportation.   Please call the [facility] Dept. at [phone] if you have any questions about these instructions.  Surgery Visitation Policy:  Patients undergoing a surgery or procedure may have one family member or support person with them as long as that person is not COVID-19 positive or experiencing its symptoms.  That person may remain in the waiting area during the procedure and may rotate out with other people.  Inpatient Visitation:    Visiting hours are 7 a.m. to 8 p.m. Up to two visitors ages 16+ are allowed at one time in a patient room. The visitors may rotate out with other people during the day. Visitors must check out when they leave, or other visitors will not be allowed. One designated support person may remain overnight. The visitor must pass COVID-19 screenings, use hand sanitizer when entering and exiting the patient's room and wear a mask at all times, including in the patient's room. Patients must also wear a mask when staff or their visitor are in the room. Masking is required regardless of vaccination status.

## 2021-05-06 ENCOUNTER — Encounter
Admission: RE | Admit: 2021-05-06 | Discharge: 2021-05-06 | Disposition: A | Discharge status: Home / Self Care | Payer: Federal, State, Local not specified - PPO | Source: Ambulatory Visit | Attending: Surgery | Admitting: Surgery

## 2021-05-06 ENCOUNTER — Other Ambulatory Visit: Payer: Self-pay

## 2021-05-06 ENCOUNTER — Other Ambulatory Visit: Payer: Federal, State, Local not specified - PPO

## 2021-05-06 VITALS — BP 147/95 | HR 73 | Temp 97.8°F | Resp 18 | Ht 66.0 in | Wt 162.5 lb

## 2021-05-06 DIAGNOSIS — Z01818 Encounter for other preprocedural examination: Secondary | ICD-10-CM | POA: Diagnosis not present

## 2021-05-06 DIAGNOSIS — Z20822 Contact with and (suspected) exposure to covid-19: Secondary | ICD-10-CM | POA: Insufficient documentation

## 2021-05-06 DIAGNOSIS — Z01812 Encounter for preprocedural laboratory examination: Secondary | ICD-10-CM

## 2021-05-06 LAB — CBC WITH DIFFERENTIAL/PLATELET
Abs Immature Granulocytes: 0.06 10*3/uL (ref 0.00–0.07)
Basophils Absolute: 0.1 10*3/uL (ref 0.0–0.1)
Basophils Relative: 1 %
Eosinophils Absolute: 0.2 10*3/uL (ref 0.0–0.5)
Eosinophils Relative: 3 %
HCT: 42.7 % (ref 36.0–46.0)
Hemoglobin: 15.4 g/dL — ABNORMAL HIGH (ref 12.0–15.0)
Immature Granulocytes: 1 %
Lymphocytes Relative: 24 %
Lymphs Abs: 1.7 10*3/uL (ref 0.7–4.0)
MCH: 34.5 pg — ABNORMAL HIGH (ref 26.0–34.0)
MCHC: 36.1 g/dL — ABNORMAL HIGH (ref 30.0–36.0)
MCV: 95.5 fL (ref 80.0–100.0)
Monocytes Absolute: 0.5 10*3/uL (ref 0.1–1.0)
Monocytes Relative: 8 %
Neutro Abs: 4.3 10*3/uL (ref 1.7–7.7)
Neutrophils Relative %: 63 %
Platelets: 264 10*3/uL (ref 150–400)
RBC: 4.47 MIL/uL (ref 3.87–5.11)
RDW: 11.3 % — ABNORMAL LOW (ref 11.5–15.5)
WBC: 6.8 10*3/uL (ref 4.0–10.5)
nRBC: 0 % (ref 0.0–0.2)

## 2021-05-06 LAB — URINALYSIS, ROUTINE W REFLEX MICROSCOPIC
Bilirubin Urine: NEGATIVE
Glucose, UA: NEGATIVE mg/dL
Hgb urine dipstick: NEGATIVE
Ketones, ur: 5 mg/dL — AB
Leukocytes,Ua: NEGATIVE
Nitrite: NEGATIVE
Protein, ur: NEGATIVE mg/dL
Specific Gravity, Urine: 1.013 (ref 1.005–1.030)
pH: 6 (ref 5.0–8.0)

## 2021-05-06 LAB — COMPREHENSIVE METABOLIC PANEL
ALT: 19 U/L (ref 0–44)
AST: 22 U/L (ref 15–41)
Albumin: 4.7 g/dL (ref 3.5–5.0)
Alkaline Phosphatase: 65 U/L (ref 38–126)
Anion gap: 9 (ref 5–15)
BUN: 13 mg/dL (ref 8–23)
CO2: 28 mmol/L (ref 22–32)
Calcium: 9.5 mg/dL (ref 8.9–10.3)
Chloride: 97 mmol/L — ABNORMAL LOW (ref 98–111)
Creatinine, Ser: 0.71 mg/dL (ref 0.44–1.00)
GFR, Estimated: >60 mL/min (ref >60)
Glucose, Bld: 115 mg/dL — ABNORMAL HIGH (ref 70–99)
Potassium: 3.7 mmol/L (ref 3.5–5.1)
Sodium: 134 mmol/L — ABNORMAL LOW (ref 135–145)
Total Bilirubin: 0.8 mg/dL (ref 0.3–1.2)
Total Protein: 7 g/dL (ref 6.5–8.1)

## 2021-05-06 LAB — TYPE AND SCREEN
ABO/RH(D): O POS
Antibody Screen: NEGATIVE

## 2021-05-06 LAB — SARS CORONAVIRUS 2 (TAT 6-24 HRS): SARS Coronavirus 2: NEGATIVE

## 2021-05-06 LAB — SURGICAL PCR SCREEN
MRSA, PCR: NEGATIVE
Staphylococcus aureus: NEGATIVE

## 2021-05-06 NOTE — Patient Instructions (Addendum)
Your procedure is scheduled on: Tuesday 05/10/21 Report to the Registration Desk on the 1st floor of the [facility]. To find out your arrival time, please call [phone] between 1PM - 3PM on: Monday 05/09/21  REMEMBER: Instructions that are not followed completely may result in serious medical risk, up to and including death; or upon the discretion of your surgeon and anesthesiologist your surgery may need to be rescheduled.  Do not eat food after midnight the night before surgery.  No gum chewing, lozengers or hard candies.  You may however, drink CLEAR liquids up to 2 hours before you are scheduled to arrive for your surgery. Do not drink anything within 2 hours of your scheduled arrival time.  Clear liquids include: - water  - apple juice without pulp - gatorade (not RED, PURPLE, OR BLUE) - black coffee or tea (Do NOT add milk or creamers to the coffee or tea) Do NOT drink anything that is not on this list.  In addition, your doctor has ordered for you to drink the provided  Ensure Pre-Surgery Clear Carbohydrate Drink   Drinking this carbohydrate drink up to two hours before surgery helps to reduce insulin resistance and improve patient outcomes. Please complete drinking 2 hours prior to scheduled arrival time.  TAKE THESE MEDICATIONS THE MORNING OF SURGERY WITH A SIP OF WATER: lansoprazole (PREVACID) 30 MG capsule (take one the night before and one on the morning of surgery - helps to prevent nausea after surgery.)  One week prior to surgery: as of today naproxen sodium (ALEVE) 220 MG tablet Stop Anti-inflammatories (NSAIDS) such as Advil, Aleve, Ibuprofen, Motrin, Naproxen, Naprosyn and Aspirin based products such as Excedrin, Goodys Powder, BC Powder. Stop ANY OVER THE COUNTER supplements until after surgery.Omega-3 Fatty Acids (FISH OIL) 500 MG CAPS, TURMERIC CURCUMIN PO, vitamin B-12 (CYANOCOBALAMIN) 1000 MCG tablet You may however, continue to take Tylenol if needed for  pain up until the day of surgery.  No Alcohol for 24 hours before or after surgery.  No Smoking including e-cigarettes for 24 hours prior to surgery.  No chewable tobacco products for at least 6 hours prior to surgery.  No nicotine patches on the day of surgery.  Do not use any "recreational" drugs for at least a week prior to your surgery.  Please be advised that the combination of cocaine and anesthesia may have negative outcomes, up to and including death. If you test positive for cocaine, your surgery will be cancelled.  On the morning of surgery brush your teeth with toothpaste and water, you may rinse your mouth with mouthwash if you wish. Do not swallow any toothpaste or mouthwash.  Use CHG Soap or wipes as directed on instruction sheet.  Do not wear jewelry, make-up, hairpins, clips or nail polish.  Do not wear lotions, powders, or perfumes.   Do not shave body from the neck down 48 hours prior to surgery just in case you cut yourself which could leave a site for infection.  Also, freshly shaved skin may become irritated if using the CHG soap.  Contact lenses may not be worn into surgery.  Do not bring valuables to the hospital. [facility] is not responsible for any missing/lost belongings or valuables.   Total Shoulder Arthroplasty:  use Benzolyl Peroxide 5% Gel as directed on instruction sheet.  Notify your doctor if there is any change in your medical condition (cold, fever, infection).  Wear comfortable clothing (specific to your surgery type) to the hospital.  After  surgery, you can help prevent lung complications by doing breathing exercises.  Take deep breaths and cough every 1-2 hours. Your doctor may order a device called an Incentive Spirometer to help you take deep breaths.  If you are being admitted to the hospital overnight, leave your suitcase in the car. After surgery it may be brought to your room.  If you are taking public transportation, you will  need to have a responsible adult (18 years or older) with you. Please confirm with your physician that it is acceptable to use public transportation.   Please call the [facility] Dept. at [phone] if you have any questions about these instructions.  Surgery Visitation Policy:  Patients undergoing a surgery or procedure may have one family member or support person with them as long as that person is not COVID-19 positive or experiencing its symptoms.  That person may remain in the waiting area during the procedure and may rotate out with other people.  Inpatient Visitation:    Visiting hours are 7 a.m. to 8 p.m. Up to two visitors ages 16+ are allowed at one time in a patient room. The visitors may rotate out with other people during the day. Visitors must check out when they leave, or other visitors will not be allowed. One designated support person may remain overnight. The visitor must pass COVID-19 screenings, use hand sanitizer when entering and exiting the patient's room and wear a mask at all times, including in the patient's room. Patients must also wear a mask when staff or their visitor are in the room. Masking is required regardless of vaccination status.

## 2021-05-09 MED ORDER — ORAL CARE MOUTH RINSE: 15.0000 mL | Freq: Once | OROMUCOSAL | Status: AC

## 2021-05-09 MED ORDER — LACTATED RINGERS IV SOLN: INTRAVENOUS | Status: DC

## 2021-05-09 MED ORDER — CHLORHEXIDINE GLUCONATE 0.12 % MT SOLN: 15.0000 mL | Freq: Once | OROMUCOSAL | Status: AC

## 2021-05-09 MED ORDER — CEFAZOLIN SODIUM-DEXTROSE 2-4 GM/100ML-% IV SOLN
2.0000 g | INTRAVENOUS | Status: AC
  Administered 2021-05-10: 2 g via INTRAVENOUS

## 2021-05-10 ENCOUNTER — Ambulatory Visit: Payer: Federal, State, Local not specified - PPO

## 2021-05-10 ENCOUNTER — Ambulatory Visit
Admission: RE | Admit: 2021-05-10 | Discharge: 2021-05-10 | Disposition: A | Discharge status: Home / Self Care | Payer: Federal, State, Local not specified - PPO | Attending: Surgery | Admitting: Surgery

## 2021-05-10 ENCOUNTER — Other Ambulatory Visit: Payer: Self-pay

## 2021-05-10 ENCOUNTER — Encounter: Payer: Self-pay | Admitting: Surgery

## 2021-05-10 ENCOUNTER — Ambulatory Visit: Payer: Federal, State, Local not specified - PPO | Admitting: Certified Registered"

## 2021-05-10 ENCOUNTER — Encounter
Admission: RE | Disposition: A | Discharge status: Home / Self Care | Payer: Self-pay | Source: Home / Self Care | Attending: Surgery

## 2021-05-10 DIAGNOSIS — Z419 Encounter for procedure for purposes other than remedying health state, unspecified: Secondary | ICD-10-CM

## 2021-05-10 DIAGNOSIS — Z791 Long term (current) use of non-steroidal anti-inflammatories (NSAID): Secondary | ICD-10-CM | POA: Diagnosis not present

## 2021-05-10 DIAGNOSIS — M75121 Complete rotator cuff tear or rupture of right shoulder, not specified as traumatic: Secondary | ICD-10-CM | POA: Diagnosis not present

## 2021-05-10 DIAGNOSIS — Z888 Allergy status to other drugs, medicaments and biological substances status: Secondary | ICD-10-CM | POA: Insufficient documentation

## 2021-05-10 DIAGNOSIS — Z79899 Other long term (current) drug therapy: Secondary | ICD-10-CM | POA: Insufficient documentation

## 2021-05-10 DIAGNOSIS — M19011 Primary osteoarthritis, right shoulder: Secondary | ICD-10-CM | POA: Insufficient documentation

## 2021-05-10 DIAGNOSIS — Z96611 Presence of right artificial shoulder joint: Secondary | ICD-10-CM

## 2021-05-10 HISTORY — PX: REVERSE SHOULDER ARTHROPLASTY: SHX5054

## 2021-05-10 LAB — ABO/RH: ABO/RH(D): O POS

## 2021-05-10 SURGERY — ARTHROPLASTY, SHOULDER, TOTAL, REVERSE
Anesthesia: General | Site: Shoulder | Laterality: Right

## 2021-05-10 MED ORDER — ACETAMINOPHEN 10 MG/ML IV SOLN
INTRAVENOUS | Status: AC
  Filled 2021-05-10: qty 100

## 2021-05-10 MED ORDER — BUPIVACAINE LIPOSOME 1.3 % IJ SUSP
INTRAMUSCULAR | Status: AC
  Filled 2021-05-10: qty 20

## 2021-05-10 MED ORDER — DEXAMETHASONE SODIUM PHOSPHATE 10 MG/ML IJ SOLN
INTRAMUSCULAR | Status: DC | PRN
  Administered 2021-05-10: 8 mg via INTRAVENOUS

## 2021-05-10 MED ORDER — SUGAMMADEX SODIUM 200 MG/2ML IV SOLN
INTRAVENOUS | Status: DC | PRN
  Administered 2021-05-10: 200 mg via INTRAVENOUS

## 2021-05-10 MED ORDER — SODIUM CHLORIDE FLUSH 0.9 % IV SOLN
INTRAVENOUS | Status: AC
  Filled 2021-05-10: qty 40

## 2021-05-10 MED ORDER — BUPIVACAINE HCL (PF) 0.5 % IJ SOLN
INTRAMUSCULAR | Status: AC
  Filled 2021-05-10: qty 10

## 2021-05-10 MED ORDER — GLYCOPYRROLATE 0.2 MG/ML IJ SOLN
INTRAMUSCULAR | Status: DC | PRN
  Administered 2021-05-10: .2 mg via INTRAVENOUS

## 2021-05-10 MED ORDER — TRANEXAMIC ACID 1000 MG/10ML IV SOLN
INTRAVENOUS | Status: DC | PRN
  Administered 2021-05-10: 1000 mg via TOPICAL

## 2021-05-10 MED ORDER — ONDANSETRON HCL 4 MG PO TABS: 4.0000 mg | ORAL_TABLET | Freq: Four times a day (QID) | ORAL | Status: DC | PRN

## 2021-05-10 MED ORDER — METOCLOPRAMIDE HCL 5 MG/ML IJ SOLN: 5.0000 mg | Freq: Three times a day (TID) | INTRAMUSCULAR | Status: DC | PRN

## 2021-05-10 MED ORDER — KETOROLAC TROMETHAMINE 15 MG/ML IJ SOLN: 7.5000 mg | Freq: Four times a day (QID) | INTRAMUSCULAR | Status: DC

## 2021-05-10 MED ORDER — METOCLOPRAMIDE HCL 10 MG PO TABS: 5.0000 mg | ORAL_TABLET | Freq: Three times a day (TID) | ORAL | Status: DC | PRN

## 2021-05-10 MED ORDER — LIDOCAINE HCL (CARDIAC) PF 100 MG/5ML IV SOSY
PREFILLED_SYRINGE | INTRAVENOUS | Status: DC | PRN
Start: 2021-05-10 — End: 2021-05-10
  Administered 2021-05-10: 20 mg via INTRAVENOUS

## 2021-05-10 MED ORDER — KETOROLAC TROMETHAMINE 15 MG/ML IJ SOLN: 15.0000 mg | Freq: Once | INTRAMUSCULAR | Status: AC

## 2021-05-10 MED ORDER — STERILE WATER FOR IRRIGATION IR SOLN
Status: DC | PRN
  Administered 2021-05-10: 1000 mL

## 2021-05-10 MED ORDER — BUPIVACAINE-EPINEPHRINE (PF) 0.5% -1:200000 IJ SOLN
INTRAMUSCULAR | Status: AC
  Filled 2021-05-10: qty 30

## 2021-05-10 MED ORDER — FENTANYL CITRATE (PF) 100 MCG/2ML IJ SOLN
INTRAMUSCULAR | Status: DC | PRN
  Administered 2021-05-10 (×2): 50 ug via INTRAVENOUS

## 2021-05-10 MED ORDER — SODIUM CHLORIDE 0.9 % IV SOLN: INTRAVENOUS | Status: DC

## 2021-05-10 MED ORDER — ACETAMINOPHEN 10 MG/ML IV SOLN
INTRAVENOUS | Status: DC | PRN
  Administered 2021-05-10: 1000 mg via INTRAVENOUS

## 2021-05-10 MED ORDER — FENTANYL CITRATE PF 50 MCG/ML IJ SOSY: 50.0000 ug | PREFILLED_SYRINGE | Freq: Once | INTRAMUSCULAR | Status: AC

## 2021-05-10 MED ORDER — ROCURONIUM BROMIDE 100 MG/10ML IV SOLN
INTRAVENOUS | Status: DC | PRN
  Administered 2021-05-10: 50 mg via INTRAVENOUS

## 2021-05-10 MED ORDER — PHENYLEPHRINE HCL-NACL 20-0.9 MG/250ML-% IV SOLN
INTRAVENOUS | Status: DC | PRN
Start: 2021-05-10 — End: 2021-05-10
  Administered 2021-05-10: 50 ug/min via INTRAVENOUS

## 2021-05-10 MED ORDER — ACETAMINOPHEN 500 MG PO TABS: 1000.0000 mg | ORAL_TABLET | Freq: Four times a day (QID) | ORAL | Status: DC

## 2021-05-10 MED ORDER — BUPIVACAINE-EPINEPHRINE 0.5% -1:200000 IJ SOLN
INTRAMUSCULAR | Status: DC | PRN
  Administered 2021-05-10: 30 mL

## 2021-05-10 MED ORDER — TRANEXAMIC ACID 1000 MG/10ML IV SOLN
INTRAVENOUS | Status: AC
  Filled 2021-05-10: qty 10

## 2021-05-10 MED ORDER — LIDOCAINE HCL (PF) 1 % IJ SOLN
INTRAMUSCULAR | Status: DC | PRN
  Administered 2021-05-10: 3 mL

## 2021-05-10 MED ORDER — CEFAZOLIN SODIUM-DEXTROSE 2-4 GM/100ML-% IV SOLN
INTRAVENOUS | Status: AC
  Filled 2021-05-10: qty 100

## 2021-05-10 MED ORDER — 0.9 % SODIUM CHLORIDE (POUR BTL) OPTIME
TOPICAL | Status: DC | PRN
  Administered 2021-05-10: 500 mL

## 2021-05-10 MED ORDER — FENTANYL CITRATE PF 50 MCG/ML IJ SOSY
PREFILLED_SYRINGE | INTRAMUSCULAR | Status: AC
  Administered 2021-05-10: 50 ug via INTRAVENOUS
  Filled 2021-05-10: qty 1

## 2021-05-10 MED ORDER — MIDAZOLAM HCL 2 MG/2ML IJ SOLN
INTRAMUSCULAR | Status: AC
  Administered 2021-05-10: 1 mg via INTRAVENOUS
  Filled 2021-05-10: qty 2

## 2021-05-10 MED ORDER — SODIUM CHLORIDE 0.9 % IR SOLN
Status: DC | PRN
  Administered 2021-05-10: 3000 mL

## 2021-05-10 MED ORDER — CHLORHEXIDINE GLUCONATE 0.12 % MT SOLN
OROMUCOSAL | Status: AC
  Administered 2021-05-10: 15 mL via OROMUCOSAL
  Filled 2021-05-10: qty 15

## 2021-05-10 MED ORDER — PROPOFOL 10 MG/ML IV BOLUS
INTRAVENOUS | Status: AC
  Filled 2021-05-10: qty 20

## 2021-05-10 MED ORDER — BUPIVACAINE LIPOSOME 1.3 % IJ SUSP
INTRAMUSCULAR | Status: DC | PRN
  Administered 2021-05-10: 20 mL via PERINEURAL

## 2021-05-10 MED ORDER — OXYCODONE HCL 5 MG PO TABS: 5.0000 mg | ORAL_TABLET | ORAL | 0 refills | Status: DC | PRN

## 2021-05-10 MED ORDER — BUPIVACAINE HCL (PF) 0.5 % IJ SOLN
INTRAMUSCULAR | Status: DC | PRN
  Administered 2021-05-10: 10 mL via PERINEURAL

## 2021-05-10 MED ORDER — LIDOCAINE HCL (PF) 1 % IJ SOLN
INTRAMUSCULAR | Status: AC
  Filled 2021-05-10: qty 5

## 2021-05-10 MED ORDER — ONDANSETRON HCL 4 MG/2ML IJ SOLN
INTRAMUSCULAR | Status: DC | PRN
  Administered 2021-05-10: 4 mg via INTRAVENOUS

## 2021-05-10 MED ORDER — ONDANSETRON HCL 4 MG/2ML IJ SOLN: 4.0000 mg | Freq: Once | INTRAMUSCULAR | Status: DC | PRN

## 2021-05-10 MED ORDER — ONDANSETRON HCL 4 MG/2ML IJ SOLN: 4.0000 mg | Freq: Four times a day (QID) | INTRAMUSCULAR | Status: DC | PRN

## 2021-05-10 MED ORDER — OXYCODONE HCL 5 MG PO TABS: 5.0000 mg | ORAL_TABLET | ORAL | Status: DC | PRN

## 2021-05-10 MED ORDER — PHENYLEPHRINE HCL (PRESSORS) 10 MG/ML IV SOLN
INTRAVENOUS | Status: DC | PRN
  Administered 2021-05-10 (×4): 80 ug via INTRAVENOUS

## 2021-05-10 MED ORDER — FENTANYL CITRATE (PF) 100 MCG/2ML IJ SOLN
INTRAMUSCULAR | Status: AC
  Filled 2021-05-10: qty 2

## 2021-05-10 MED ORDER — EPHEDRINE SULFATE 50 MG/ML IJ SOLN
INTRAMUSCULAR | Status: DC | PRN
  Administered 2021-05-10 (×2): 5 mg via INTRAVENOUS

## 2021-05-10 MED ORDER — PROPOFOL 10 MG/ML IV BOLUS
INTRAVENOUS | Status: DC | PRN
  Administered 2021-05-10: 30 mg via INTRAVENOUS
  Administered 2021-05-10: 120 mg via INTRAVENOUS
  Administered 2021-05-10: 30 mg via INTRAVENOUS

## 2021-05-10 MED ORDER — KETOROLAC TROMETHAMINE 15 MG/ML IJ SOLN
INTRAMUSCULAR | Status: AC
  Administered 2021-05-10: 15 mg via INTRAVENOUS
  Filled 2021-05-10: qty 1

## 2021-05-10 MED ORDER — MIDAZOLAM HCL 2 MG/2ML IJ SOLN: 1.0000 mg | INTRAMUSCULAR | Status: DC | PRN

## 2021-05-10 MED ORDER — FENTANYL CITRATE (PF) 100 MCG/2ML IJ SOLN: 25.0000 ug | INTRAMUSCULAR | Status: DC | PRN

## 2021-05-10 MED ORDER — CEFAZOLIN SODIUM-DEXTROSE 2-4 GM/100ML-% IV SOLN
2.0000 g | Freq: Four times a day (QID) | INTRAVENOUS | Status: DC
  Administered 2021-05-10: 2 g via INTRAVENOUS

## 2021-05-10 SURGICAL SUPPLY — 70 items
BASEPLATE GLENOSPHERE 25 (Plate) ×1 IMPLANT
BEARING HUMERAL SHLDER 36M STD (Shoulder) IMPLANT
BIT DRILL TWIST 2.7 (BIT) ×1 IMPLANT
BLADE SAW SAG 25X90X1.19 (BLADE) ×2 IMPLANT
BNDG COHESIVE 4X5 TAN ST LF (GAUZE/BANDAGES/DRESSINGS) ×2 IMPLANT
CHLORAPREP W/TINT 26 (MISCELLANEOUS) ×2 IMPLANT
COOLER POLAR GLACIER W/PUMP (MISCELLANEOUS) ×2 IMPLANT
COVER BACK TABLE REUSABLE LG (DRAPES) ×2 IMPLANT
DRAPE 3/4 80X56 (DRAPES) ×4 IMPLANT
DRAPE IMP U-DRAPE 54X76 (DRAPES) ×4 IMPLANT
DRAPE INCISE IOBAN 66X45 STRL (DRAPES) ×4 IMPLANT
DRSG OPSITE POSTOP 4X8 (GAUZE/BANDAGES/DRESSINGS) ×2 IMPLANT
ELECT BLADE 6.5 EXT (BLADE) IMPLANT
ELECT CAUTERY BLADE 6.4 (BLADE) ×2 IMPLANT
ELECT REM PT RETURN 9FT ADLT (ELECTROSURGICAL) ×2
ELECTRODE REM PT RTRN 9FT ADLT (ELECTROSURGICAL) ×1 IMPLANT
GAUZE XEROFORM 1X8 LF (GAUZE/BANDAGES/DRESSINGS) ×2 IMPLANT
GLENOID SPHERE STD STRL 36MM (Orthopedic Implant) ×1 IMPLANT
GLOVE SRG 8 PF TXTR STRL LF DI (GLOVE) ×2 IMPLANT
GLOVE SURG ENC MOIS LTX SZ7.5 (GLOVE) ×8 IMPLANT
GLOVE SURG ENC MOIS LTX SZ8 (GLOVE) ×8 IMPLANT
GLOVE SURG UNDER LTX SZ8 (GLOVE) ×2 IMPLANT
GLOVE SURG UNDER POLY LF SZ8 (GLOVE) ×4
GOWN STRL REUS W/ TWL LRG LVL3 (GOWN DISPOSABLE) ×1 IMPLANT
GOWN STRL REUS W/ TWL XL LVL3 (GOWN DISPOSABLE) ×1 IMPLANT
GOWN STRL REUS W/TWL LRG LVL3 (GOWN DISPOSABLE) ×2
GOWN STRL REUS W/TWL XL LVL3 (GOWN DISPOSABLE) ×2
ILLUMINATOR WAVEGUIDE N/F (MISCELLANEOUS) IMPLANT
IV NS IRRIG 3000ML ARTHROMATIC (IV SOLUTION) ×2 IMPLANT
KIT STABILIZATION SHOULDER (MISCELLANEOUS) ×2 IMPLANT
KIT TURNOVER KIT A (KITS) ×2 IMPLANT
MANIFOLD NEPTUNE II (INSTRUMENTS) ×2 IMPLANT
MASK FACE SPIDER DISP (MASK) ×2 IMPLANT
MAT ABSORB  FLUID 56X50 GRAY (MISCELLANEOUS) ×2
MAT ABSORB FLUID 56X50 GRAY (MISCELLANEOUS) ×1 IMPLANT
NDL SAFETY ECLIPSE 18X1.5 (NEEDLE) ×1 IMPLANT
NDL SPNL 20GX3.5 QUINCKE YW (NEEDLE) ×1 IMPLANT
NEEDLE HYPO 18GX1.5 SHARP (NEEDLE) ×2
NEEDLE HYPO 22GX1.5 SAFETY (NEEDLE) ×2 IMPLANT
NEEDLE SPNL 20GX3.5 QUINCKE YW (NEEDLE) ×2 IMPLANT
NS IRRIG 500ML POUR BTL (IV SOLUTION) ×2 IMPLANT
PACK ARTHROSCOPY SHOULDER (MISCELLANEOUS) ×2 IMPLANT
PAD ARMBOARD 7.5X6 YLW CONV (MISCELLANEOUS) ×2 IMPLANT
PAD WRAPON POLAR SHDR UNIV (MISCELLANEOUS) ×1 IMPLANT
PENCIL SMOKE EVACUATOR (MISCELLANEOUS) ×2 IMPLANT
PIN THREADED REVERSE (PIN) ×1 IMPLANT
PULSAVAC PLUS IRRIG FAN TIP (DISPOSABLE) ×2
SCREW BONE CORT 6.5X35MM (Screw) ×1 IMPLANT
SCREW BONE LOCKING 4.75X30X3.5 (Screw) ×1 IMPLANT
SCREW LOCKING 4.75MMX15MM (Screw) ×1 IMPLANT
SCREW LOCKING NS 4.75MMX20MM (Screw) ×1 IMPLANT
SCREW NON-LOCK 4.75MMX15MM (Screw) ×1 IMPLANT
SHOULDER HUMERAL BEAR 36M STD (Shoulder) ×2 IMPLANT
SLING ULTRA II M (MISCELLANEOUS) ×2 IMPLANT
SPONGE T-LAP 18X18 ~~LOC~~+RFID (SPONGE) ×4 IMPLANT
STAPLER SKIN PROX 35W (STAPLE) ×2 IMPLANT
STEM HUMERAL STRL 12MMX14MM (Stem) ×1 IMPLANT
SUT ETHIBOND 0 MO6 C/R (SUTURE) ×2 IMPLANT
SUT FIBERWIRE #2 38 BLUE 1/2 (SUTURE) ×8
SUT VIC AB 0 CT1 36 (SUTURE) ×2 IMPLANT
SUT VIC AB 2-0 CT1 27 (SUTURE) ×4
SUT VIC AB 2-0 CT1 TAPERPNT 27 (SUTURE) ×2 IMPLANT
SUTURE FIBERWR #2 38 BLUE 1/2 (SUTURE) ×4 IMPLANT
SYR 10ML LL (SYRINGE) ×2 IMPLANT
SYR 30ML LL (SYRINGE) ×2 IMPLANT
TIP FAN IRRIG PULSAVAC PLUS (DISPOSABLE) ×1 IMPLANT
TOWEL OR 17X26 4PK STRL BLUE (TOWEL DISPOSABLE) ×2 IMPLANT
TRAY HUM MINI SHOULDER +0 40D (Shoulder) ×1 IMPLANT
WATER STERILE IRR 500ML POUR (IV SOLUTION) ×2 IMPLANT
WRAPON POLAR PAD SHDR UNIV (MISCELLANEOUS) ×2

## 2021-05-10 NOTE — Anesthesia Procedure Notes (Signed)
Anesthesia Regional Block: Interscalene brachial plexus block   Pre-Anesthetic Checklist: , timeout performed,  Correct Patient, Correct Site, Correct Laterality,  Correct Procedure, Correct Position, site marked,  Risks and benefits discussed,  Surgical consent,  Pre-op evaluation,  At surgeon's request and post-op pain management  Laterality: Right  Prep: chloraprep       Needles:  Injection technique: Single-shot  Needle Type: Stimiplex     Needle Length: 10cm  Needle Gauge: 21     Additional Needles:   Procedures:,,,, ultrasound used (permanent image in chart),,    Narrative:  Start time: 05/10/2021 10:10 AM End time: 05/10/2021 10:15 AM Injection made incrementally with aspirations every 5 mL.  Performed by: Personally  Anesthesiologist: Emmie Niemann, MD  Additional Notes: Functioning IV was confirmed and monitors were applied.  A Stimuplex needle was used. Sterile prep and drape,hand hygiene and sterile gloves were used.  Negative aspiration and negative test dose prior to incremental administration of local anesthetic. The patient tolerated the procedure well.

## 2021-05-10 NOTE — Discharge Instructions (Addendum)
Orthopedic discharge instructions: May shower with intact OpSite dressing once block has worn off.  Apply ice frequently to shoulder or use Polar Care device. Take Celebrex BID with meals for 7-10 days, then as necessary. Take oxycodone as prescribed when needed.  May supplement with ES Tylenol if necessary. Keep shoulder immobilizer on at all times except may remove for bathing purposes. Follow-up in 10-14 days or as scheduled.    AMBULATORY SURGERY  DISCHARGE INSTRUCTIONS   The drugs that you were given will stay in your system until tomorrow so for the next 24 hours you should not:  Drive an automobile Make any legal decisions Drink any alcoholic beverage   You may resume regular meals tomorrow.  Today it is better to start with liquids and gradually work up to solid foods.  You may eat anything you prefer, but it is better to start with liquids, then soup and crackers, and gradually work up to solid foods.   Please notify your doctor immediately if you have any unusual bleeding, trouble breathing, redness and pain at the surgery site, drainage, fever, or pain not relieved by medication.    Additional Instructions: Please contact your physician with any problems or Same Day Surgery at [phone], Monday through Friday 6 am to 4 pm, or [place] at [facility] number at [phone].

## 2021-05-10 NOTE — Anesthesia Procedure Notes (Signed)
Procedure Name: Intubation Date/Time: 05/10/2021 10:29 AM Performed by: Lerry Liner, CRNA Pre-anesthesia Checklist: Patient identified, Emergency Drugs available, Suction available and Patient being monitored Patient Re-evaluated:Patient Re-evaluated prior to induction Oxygen Delivery Method: Circle system utilized Preoxygenation: Pre-oxygenation with 100% oxygen Induction Type: IV induction Ventilation: Mask ventilation without difficulty Laryngoscope Size: McGraph and 3 Grade View: Grade I Tube type: Oral Tube size: 7.0 mm Number of attempts: 1 Airway Equipment and Method: Stylet and Oral airway Placement Confirmation: ETT inserted through vocal cords under direct vision, positive ETCO2 and breath sounds checked- equal and bilateral Secured at: 22 cm Tube secured with: Tape Dental Injury: Teeth and Oropharynx as per pre-operative assessment

## 2021-05-10 NOTE — H&P (Signed)
History of Present Illness: Caroline Francis is a 70 y.o. who presents today for history and physical. She is to undergo a reverse right total shoulder arthroplasty on 05/10/2021. Last seen in the clinic on 03/25/2021. There is been no change in her condition since that time.  The patient's symptoms began several years ago and developed without any specific cause or injury. However, she wonders if her being "overzealous" with her water aerobic exercises may have contributed to the onset of her symptoms. Because of worsening symptoms, she saw Cameron Proud, PA-C, who ordered an MRI scan and referred her to me for further evaluation and treatment. The patient describes the symptoms as moderate (patient is active but has had to make modifications or give up activities) and have the quality of being aching, constant, miserable, nagging, stabbing, tender and throbbing. The pain is localized to the lateral arm/shoulder. These symptoms are aggravated with normal daily activities, with sleeping, at higher levels of activity, with overhead activity and reaching behind the back. She has tried acetaminophen and non-steroidal anti-inflammatories (Advil and Celebrex ) with temporary partial relief. She has tried rest and ice with limited benefit. She has not tried any steroid injections or physical therapy for these symptoms. She denies any neck pain, nor did she note any numbness or paresthesias down her arm to her hand.  Patient states that her pain is increased to the point that is significantly interfering with her activities of daily living and she wishes to proceed with surgery to the right shoulder.  Past Medical History:  Chicken pox   GERD (gastroesophageal reflux disease) 07/28/2016   History of adenomatous polyp of colon 07/28/2016   Hypertension   Other dysphagia 07/28/2016   Thyroid disease   Past Surgical History:  COLONOSCOPY 09/05/2016 (Adenomatous Polyps: CBF 08/2021)   EGD 09/05/2012 (No repeat per  RTE)   EGD 09/05/2016 (Gastritis, Esophagitis: No repeat per RTE)   HYSTERECTOMY   Past Family History:  No Known Problems Mother   Lung cancer Father   No Known Problems Sister   No Known Problems Brother   Medications:  celecoxib (CELEBREX) 200 MG capsule TAKE 1 CAPSULE BY MOUTH TWICE A DAY 60 capsule 0   hydroCHLOROthiazide (HYDRODIURIL) 25 MG tablet Take 1 tablet by mouth once daily   ibuprofen (MOTRIN) 200 MG tablet Take 400 mg by mouth as needed for Pain   lansoprazole (PREVACID) 30 MG DR capsule Take 1 capsule (30 mg total) by mouth 2 (two) times daily. 60 capsule 3   olmesartan (BENICAR) 40 MG tablet Take 40 mg by mouth once daily   Allergies:  Lisinopril-Hydrochlorothiazide Cough   Review of Systems: A comprehensive 14 point ROS was performed, reviewed, and the pertinent orthopaedic findings are documented in the HPI.  Physical Exam: BP 122/84 (BP Location: Left upper arm, Patient Position: Sitting, BP Cuff Size: Adult)  Ht 167.6 cm (5\' 6" )  Wt 75 kg (165 lb 6.4 oz)  BMI 26.70 kg/m   General: Well-developed well-nourished female seen in no acute distress.   HEENT: Atraumatic,normocephalic. Pupils are equal and reactive to light. Oropharynx is clear with moist mucosa  Lungs: Clear to auscultation bilaterally   Cardiovascular: Regular rate and rhythm. Normal S1, S2. No murmurs. No appreciable gallops or rubs. Peripheral pulses are palpable.  Abdomen: Soft, non-tender, nondistended. Bowel sounds present  Right shoulder exam: SKIN: normal SWELLING: none WARMTH: none LYMPH NODES: no adenopathy palpable CREPITUS: none TENDERNESS: Mildly tender along lateral acromion ROM (active):  Forward flexion:  165 degrees Abduction: 160 degrees Internal rotation: T12 ROM (passive):  Forward flexion: 170 degrees Abduction: 165 degrees  ER/IR at 90 abd: 85 degrees / 75 degrees   She has mild pain with forward flexion, abduction, and external rotation at 90 degrees of  abduction.   STRENGTH: Forward flexion: 3/5 Abduction: 3+-4/5 External rotation: 4/5 Internal rotation: 4+/5 Pain with RC testing: Moderate pain with resisted forward flexion and mild pain with resisted abduction and external rotation.   STABILITY: Normal   SPECIAL TESTS: Luan Pulling' test: positive, mild Speed's test: Not evaluated Capsulitis - pain w/ passive ER: no Crossed arm test: Minimally positive Crank: Not evaluated Anterior apprehension: Negative Posterior apprehension: Not evaluated  Neurological: The patient is alert and oriented Sensation to light touch appears to be intact and within normal limits Gross motor strength appeared to be equal to 5/5  Vascular : Peripheral pulses felt to be palpable. Capillary refill appears to be intact and within normal limits  RightShoulder Imaging, MRI: MRI Shoulder Cartilage: Partial thickness humeral head cartilage loss. Partial thickness glenoid cartilage loss. MRI Shoulder Rotator Cuff: Full-thickness tears of the supraspinatus, infraspinatus, and subscapularis tendons. Retracted to the glenohumeral joint. MRI Shoulder Labrum / Biceps: Biceps tendinopathy. MRI Shoulder Bone: Normal bone.  Impression: 1. Rotator cuff arthropathy right shoulder. 2. Nontraumatic complete tear right rotator cuff.  Plan:  The treatment options, including both surgical and nonsurgical choices, have been discussed in detail with the patient and his/her family. The risks (including bleeding, infection, nerve and/or blood vessel injury, persistent or recurrent pain, loosening or failure of the components, leg length inequality, dislocation, need for further surgery, blood clots, strokes, heart attacks or arrhythmias, pneumonia, etc.) and benefits of the surgical procedure were discussed. The patient states his/her understanding and agrees to proceed. @CAPHE @ agrees to a blood transfusion if necessary. A formal written consent will be obtained by the nursing  staff.    H&P reviewed and patient re-examined. No changes.

## 2021-05-10 NOTE — Transfer of Care (Signed)
Immediate Anesthesia Transfer of Care Note  Patient: Caroline Francis  Procedure(s) Performed: REVERSE SHOULDER ARTHROPLASTY (Right: Shoulder)  Patient Location: PACU  Anesthesia Type:General  Level of Consciousness: drowsy  Airway & Oxygen Therapy: Patient Spontanous Breathing and Patient connected to face mask oxygen  Post-op Assessment: Report given to RN  Post vital signs: stable  Last Vitals:  Vitals Value Taken Time  BP 106/54 05/10/21 1230  Temp    Pulse 91 05/10/21 1229  Resp 15 05/10/21 1230  SpO2 99 % 05/10/21 1229  Vitals shown include unvalidated device data.  Last Pain:  Vitals:   05/10/21 0842  TempSrc: Temporal  PainSc: 10-Worst pain ever         Complications: No notable events documented.

## 2021-05-10 NOTE — Anesthesia Preprocedure Evaluation (Signed)
Anesthesia Evaluation  Patient identified by MRN, date of birth, ID band Patient awake    Reviewed: Allergy & Precautions, NPO status , Patient's Chart, lab work & pertinent test results  History of Anesthesia Complications Negative for: history of anesthetic complications  Airway Mallampati: II  TM Distance: >3 FB Neck ROM: Full    Dental no notable dental hx.    Pulmonary neg pulmonary ROS, neg sleep apnea, neg COPD,    breath sounds clear to auscultation- rhonchi (-) wheezing      Cardiovascular hypertension, Pt. on medications (-) CAD, (-) Past MI, (-) Cardiac Stents and (-) CABG  Rhythm:Regular Rate:Normal - Systolic murmurs and - Diastolic murmurs    Neuro/Psych neg Seizures negative neurological ROS  negative psych ROS   GI/Hepatic Neg liver ROS, GERD  ,  Endo/Other  negative endocrine ROSneg diabetes  Renal/GU negative Renal ROS     Musculoskeletal  (+) Arthritis ,   Abdominal (+) - obese,   Peds  Hematology negative hematology ROS (+)   Anesthesia Other Findings Past Medical History: No date: GERD (gastroesophageal reflux disease) No date: Hyperlipidemia No date: Hypertension No date: Low TSH level   Reproductive/Obstetrics                             Anesthesia Physical Anesthesia Plan  ASA: 2  Anesthesia Plan: General   Post-op Pain Management:  Regional for Post-op pain   Induction: Intravenous  PONV Risk Score and Plan: 2 and Ondansetron, Dexamethasone and Midazolam  Airway Management Planned: Oral ETT  Additional Equipment:   Intra-op Plan:   Post-operative Plan: Extubation in OR  Informed Consent: I have reviewed the patients History and Physical, chart, labs and discussed the procedure including the risks, benefits and alternatives for the proposed anesthesia with the patient or authorized representative who has indicated his/her understanding and  acceptance.     Dental advisory given  Plan Discussed with: CRNA and Anesthesiologist  Anesthesia Plan Comments:         Anesthesia Quick Evaluation

## 2021-05-10 NOTE — Op Note (Signed)
05/10/2021  12:18 PM  Patient:   Caroline Francis  Pre-Op Diagnosis:   Massive irreparable rotator cuff tear with early cuff arthropathy, right shoulder.  Post-Op Diagnosis:   Same  Procedure:   Reverse right total shoulder arthroplasty.  Surgeon:   Pascal Lux, MD  Assistant:   Cameron Proud, PA-C  Anesthesia:   General endotracheal with an interscalene block using Exparel placed preoperatively by the anesthesiologist.  Findings:   As above.  Complications:   None  EBL:   100 cc  Fluids:   1000 cc crystalloid  UOP:   None  TT:   None  Drains:   None  Closure:   Staples  Implants:   All press-fit Biomet Comprehensive system with a #12 micro-humeral stem, a 40 mm humeral tray with a standard insert, and a mini-base plate with a 36 mm glenosphere.  Brief Clinical Note:   The patient is a 70 year old female with a long history of progressive worsening pain and weakness of her right shoulder. Her symptoms have progressed despite medications, activity modification, etc. Her history and examination are consistent with a chronic massive rotator cuff tear with early cuff arthropathy. The patient presents at this time for a reverse right total shoulder arthroplasty.  Procedure:   The patient underwent placement of an interscalene block using Exparel by the anesthesiologist in the preoperative holding area before being brought into the operating room and lain in the supine position. The patient then underwent general endotracheal intubation and anesthesia before the patient was repositioned in the beach chair position using the beach chair positioner. The right shoulder and upper extremity were prepped with ChloraPrep solution before being draped sterilely. Preoperative antibiotics were administered. A timeout was performed to verify the appropriate surgical site.    A standard anterior approach to the shoulder was made through an approximately 4-5 inch incision. The incision was  carried down through the subcutaneous tissues to expose the deltopectoral fascia. The interval between the deltoid and pectoralis muscles was identified and this plane developed, retracting the cephalic vein laterally with the deltoid muscle. The conjoined tendon was identified. Its lateral margin was dissected and the Kolbel self-retraining retractor inserted. The "three sisters" were identified and cauterized. Bursal tissues were removed to improve visualization. The subscapularis tendon was released from its attachment to the lesser tuberosity 1 cm proximal to its insertion and several tagging sutures placed. The inferior capsule was released with care after identifying and protecting the axillary nerve. The proximal humeral cut was made at approximately 25 of retroversion using the extra-medullary guide.   Attention was redirected to the glenoid. The labrum was debrided circumferentially before the center of the glenoid was marked with electrocautery. The guidewire was drilled into the glenoid neck using the appropriate guide. After verifying its position, it was overreamed with the mini-baseplate reamer to create a flat surface. The permanent mini-baseplate was impacted into place. It was stabilized with a 35 x 6.5 mm central screw and four peripheral screws. Locking screws were placed superiorly, posteriorly, and inferiorly while a nonlocking screw was placed anteriorly. The permanent 36 mm glenosphere was then impacted into place and its Morse taper locking mechanism verified using manual distraction.  Attention was directed to the humeral side. The humeral canal was reamed sequentially beginning with the end-cutting reamer then progressing from a 4 mm reamer up to a 12 mm reamer. This provided excellent circumferential chatter. The canal was broached beginning with a #8 broach and progressing to a #12  broach. This was left in place and a trial reduction performed using the standard trial humeral  platform. The arm demonstrated excellent range of motion as the hand could be brought across the chest to the opposite shoulder and brought to the top of the patient's head and to the patient's ear. The shoulder appeared stable throughout this range of motion. The joint was dislocated and the trial components removed. The permanent #12 micro-stem was impacted into place with care taken to maintain the appropriate version. The permanent 40 mm humeral platform with the standard insert was put together on the back table and impacted into place. Again, the [facility] taper locking mechanism was verified using manual distraction. The shoulder was relocated using two finger pressure and again placed through a range of motion with the findings as described above.  The wound was copiously irrigated with sterile saline solution using the jet lavage system before a total of 20 cc of Exparel diluted out to 60 cc with normal saline and 30 cc of 0.5% Sensorcaine with epinephrine was injected into the pericapsular and peri-incisional tissues to help with postoperative analgesia. The subscapularis tendon was reapproximated using #2 FiberWire interrupted sutures. The deltopectoral interval was closed using #0 Vicryl interrupted sutures before the subcutaneous tissues were closed using 2-0 Vicryl interrupted sutures. The skin was closed using staples. Prior to closing the skin, 1 g of transexemic acid in 10 cc of normal saline was injected intra-articularly to help with postoperative bleeding. A sterile occlusive dressing was applied to the wound before the arm was placed into a shoulder immobilizer with an abduction pillow. A Polar Care system also was applied to the shoulder. The patient was then transferred back to a hospital bed before being awakened, extubated, and returned to the recovery room in satisfactory condition after tolerating the procedure well.

## 2021-05-11 ENCOUNTER — Encounter: Payer: Self-pay | Admitting: Surgery

## 2021-05-11 DIAGNOSIS — Z96611 Presence of right artificial shoulder joint: Secondary | ICD-10-CM | POA: Insufficient documentation

## 2021-05-11 LAB — SURGICAL PATHOLOGY

## 2021-05-11 NOTE — Addendum Note (Signed)
Addendum  created 05/11/21 0756 by Emmie Niemann, MD   Clinical Note Signed

## 2021-05-11 NOTE — Anesthesia Postprocedure Evaluation (Addendum)
Anesthesia Post Note  Patient: Caroline Francis  Procedure(s) Performed: REVERSE SHOULDER ARTHROPLASTY (Right: Shoulder)  Patient location during evaluation: PACU Anesthesia Type: General Level of consciousness: awake and alert and oriented Pain management: pain level controlled Vital Signs Assessment: post-procedure vital signs reviewed and stable Respiratory status: spontaneous breathing, nonlabored ventilation and respiratory function stable Cardiovascular status: blood pressure returned to baseline and stable Postop Assessment: no signs of nausea or vomiting Anesthetic complications: no   No notable events documented.   Last Vitals:  Vitals:   05/10/21 1330 05/10/21 1550  BP: 124/71 117/72  Pulse: 84 95  Resp: 15 16  Temp: (!) 36.1 C (!) 36.2 C  SpO2: 94% 98%    Last Pain:  Vitals:   05/10/21 1550  TempSrc: Temporal  PainSc: 0-No pain                 Kindal Ponti

## 2021-05-13 DIAGNOSIS — Z471 Aftercare following joint replacement surgery: Secondary | ICD-10-CM | POA: Diagnosis not present

## 2021-05-13 DIAGNOSIS — E079 Disorder of thyroid, unspecified: Secondary | ICD-10-CM | POA: Diagnosis not present

## 2021-05-13 DIAGNOSIS — Z86010 Personal history of colonic polyps: Secondary | ICD-10-CM | POA: Diagnosis not present

## 2021-05-13 DIAGNOSIS — R1310 Dysphagia, unspecified: Secondary | ICD-10-CM | POA: Diagnosis not present

## 2021-05-13 DIAGNOSIS — Z90710 Acquired absence of both cervix and uterus: Secondary | ICD-10-CM | POA: Diagnosis not present

## 2021-05-13 DIAGNOSIS — I10 Essential (primary) hypertension: Secondary | ICD-10-CM | POA: Diagnosis not present

## 2021-05-13 DIAGNOSIS — Z96611 Presence of right artificial shoulder joint: Secondary | ICD-10-CM | POA: Diagnosis not present

## 2021-05-13 DIAGNOSIS — K219 Gastro-esophageal reflux disease without esophagitis: Secondary | ICD-10-CM | POA: Diagnosis not present

## 2021-05-16 DIAGNOSIS — R1310 Dysphagia, unspecified: Secondary | ICD-10-CM | POA: Diagnosis not present

## 2021-05-16 DIAGNOSIS — I10 Essential (primary) hypertension: Secondary | ICD-10-CM | POA: Diagnosis not present

## 2021-05-16 DIAGNOSIS — Z86010 Personal history of colonic polyps: Secondary | ICD-10-CM | POA: Diagnosis not present

## 2021-05-16 DIAGNOSIS — K219 Gastro-esophageal reflux disease without esophagitis: Secondary | ICD-10-CM | POA: Diagnosis not present

## 2021-05-16 DIAGNOSIS — Z471 Aftercare following joint replacement surgery: Secondary | ICD-10-CM | POA: Diagnosis not present

## 2021-05-16 DIAGNOSIS — E079 Disorder of thyroid, unspecified: Secondary | ICD-10-CM | POA: Diagnosis not present

## 2021-05-17 ENCOUNTER — Ambulatory Visit: Payer: Federal, State, Local not specified - PPO

## 2021-05-18 DIAGNOSIS — K219 Gastro-esophageal reflux disease without esophagitis: Secondary | ICD-10-CM | POA: Diagnosis not present

## 2021-05-18 DIAGNOSIS — I10 Essential (primary) hypertension: Secondary | ICD-10-CM | POA: Diagnosis not present

## 2021-05-18 DIAGNOSIS — R1310 Dysphagia, unspecified: Secondary | ICD-10-CM | POA: Diagnosis not present

## 2021-05-18 DIAGNOSIS — E079 Disorder of thyroid, unspecified: Secondary | ICD-10-CM | POA: Diagnosis not present

## 2021-05-18 DIAGNOSIS — Z471 Aftercare following joint replacement surgery: Secondary | ICD-10-CM | POA: Diagnosis not present

## 2021-05-18 DIAGNOSIS — Z86010 Personal history of colonic polyps: Secondary | ICD-10-CM | POA: Diagnosis not present

## 2021-05-20 DIAGNOSIS — Z86010 Personal history of colonic polyps: Secondary | ICD-10-CM | POA: Diagnosis not present

## 2021-05-20 DIAGNOSIS — Z471 Aftercare following joint replacement surgery: Secondary | ICD-10-CM | POA: Diagnosis not present

## 2021-05-20 DIAGNOSIS — K219 Gastro-esophageal reflux disease without esophagitis: Secondary | ICD-10-CM | POA: Diagnosis not present

## 2021-05-20 DIAGNOSIS — I10 Essential (primary) hypertension: Secondary | ICD-10-CM | POA: Diagnosis not present

## 2021-05-20 DIAGNOSIS — R1310 Dysphagia, unspecified: Secondary | ICD-10-CM | POA: Diagnosis not present

## 2021-05-20 DIAGNOSIS — E079 Disorder of thyroid, unspecified: Secondary | ICD-10-CM | POA: Diagnosis not present

## 2021-05-24 DIAGNOSIS — I10 Essential (primary) hypertension: Secondary | ICD-10-CM | POA: Diagnosis not present

## 2021-05-24 DIAGNOSIS — R1310 Dysphagia, unspecified: Secondary | ICD-10-CM | POA: Diagnosis not present

## 2021-05-24 DIAGNOSIS — Z471 Aftercare following joint replacement surgery: Secondary | ICD-10-CM | POA: Diagnosis not present

## 2021-05-24 DIAGNOSIS — Z86010 Personal history of colonic polyps: Secondary | ICD-10-CM | POA: Diagnosis not present

## 2021-05-24 DIAGNOSIS — K219 Gastro-esophageal reflux disease without esophagitis: Secondary | ICD-10-CM | POA: Diagnosis not present

## 2021-05-24 DIAGNOSIS — E079 Disorder of thyroid, unspecified: Secondary | ICD-10-CM | POA: Diagnosis not present

## 2021-06-08 ENCOUNTER — Other Ambulatory Visit: Payer: Self-pay

## 2021-06-08 ENCOUNTER — Ambulatory Visit (INDEPENDENT_AMBULATORY_CARE_PROVIDER_SITE_OTHER): Payer: Federal, State, Local not specified - PPO

## 2021-06-08 DIAGNOSIS — E538 Deficiency of other specified B group vitamins: Secondary | ICD-10-CM

## 2021-06-08 MED ORDER — CYANOCOBALAMIN 1000 MCG/ML IJ SOLN
1000.0000 ug | Freq: Once | INTRAMUSCULAR | Status: AC
  Administered 2021-06-08: 1000 ug via INTRAMUSCULAR

## 2021-06-08 NOTE — Progress Notes (Signed)
Per orders of Dr. Glori Bickers, injection of monthly B12 1000 mcg/ml given by Pilar Grammes, CMA in Left Deltoid (at pt request).  Patient tolerated injection well.

## 2021-07-13 ENCOUNTER — Other Ambulatory Visit: Payer: Self-pay

## 2021-07-13 ENCOUNTER — Ambulatory Visit (INDEPENDENT_AMBULATORY_CARE_PROVIDER_SITE_OTHER): Payer: Federal, State, Local not specified - PPO

## 2021-07-13 DIAGNOSIS — E538 Deficiency of other specified B group vitamins: Secondary | ICD-10-CM | POA: Diagnosis not present

## 2021-07-13 MED ORDER — CYANOCOBALAMIN 1000 MCG/ML IJ SOLN
1000.0000 ug | Freq: Once | INTRAMUSCULAR | Status: AC
  Administered 2021-07-13: 16:00:00 1000 ug via INTRAMUSCULAR

## 2021-07-13 NOTE — Progress Notes (Signed)
Per orders of Dr. Glori Bickers, an injection of B12 was given in left deltoid by Ophelia Shoulder, CMA. Patient tolerated injection well.

## 2021-07-20 NOTE — Progress Notes (Deleted)
Subjective:   Caroline Francis is a 71 y.o. female who presents for Medicare Annual (Subsequent) preventive examination.  I connected with Bjorn Pippin today by telephone and verified that I am speaking with the correct person using two identifiers. Location patient: home Location provider: work Persons participating in the virtual visit: patient, Marine scientist.    I discussed the limitations, risks, security and privacy concerns of performing an evaluation and management service by telephone and the availability of in person appointments. I also discussed with the patient that there may be a patient responsible charge related to this service. The patient expressed understanding and verbally consented to this telephonic visit.    Interactive audio and video telecommunications were attempted between this provider and patient, however failed, due to patient having technical difficulties OR patient did not have access to video capability.  We continued and completed visit with audio only.  Some vital signs may be absent or patient reported.   Time Spent with patient on telephone encounter: *** minutes  Review of Systems           Objective:    There were no vitals filed for this visit. There is no height or weight on file to calculate BMI.  Advanced Directives 05/10/2021 05/06/2021 07/21/2020  Does Patient Have a Medical Advance Directive? Yes Yes Yes  Type of Paramedic of [place];Living will [place];Living will [place];Living will  Does patient want to make changes to medical advance directive? - No - Patient declined -  Copy of [place] in Chart? - No - copy requested No - copy requested    Current Medications (verified) Outpatient Encounter Medications as of 07/22/2021  Medication Sig   celecoxib (CELEBREX) 200 MG capsule Take 200 mg by mouth 2 (two) times daily.   hydrochlorothiazide (HYDRODIURIL)  25 MG tablet Take 1 tablet (25 mg total) by mouth daily.   lansoprazole (PREVACID) 30 MG capsule Take 1 capsule (30 mg total) by mouth 2 (two) times daily.   olmesartan (BENICAR) 40 MG tablet Take 1 tablet (40 mg total) by mouth daily.   Omega-3 Fatty Acids (FISH OIL) 500 MG CAPS Take 500 mg by mouth daily.   OVER THE COUNTER MEDICATION Apply 1 application topically daily as needed (muscle/joint pain). Cool azul topical gel   oxyCODONE (ROXICODONE) 5 MG immediate release tablet Take 1-2 tablets (5-10 mg total) by mouth every 4 (four) hours as needed for moderate pain or severe pain.   TURMERIC CURCUMIN PO Take 1 capsule by mouth daily.   vitamin B-12 (CYANOCOBALAMIN) 1000 MCG tablet Take 1,000 mcg by mouth daily.   No facility-administered encounter medications on file as of 07/22/2021.    Allergies (verified) Lisinopril   History: Past Medical History:  Diagnosis Date   GERD (gastroesophageal reflux disease)    Hyperlipidemia    Hypertension    Low TSH level    Past Surgical History:  Procedure Laterality Date   ABDOMINAL HYSTERECTOMY     partial /bleeding   NASAL SEPTUM SURGERY     REVERSE SHOULDER ARTHROPLASTY Right 05/10/2021   Procedure: REVERSE SHOULDER ARTHROPLASTY;  Surgeon: Corky Mull, MD;  Location: [facility] ORS;  Service: Orthopedics;  Laterality: Right;   No family history on file. Social History   Socioeconomic History   Marital status: Divorced    Spouse name: Not on file   Number of children: Not on file   Years of education: Not on file  Highest education level: Not on file  Occupational History   Not on file  Tobacco Use   Smoking status: Never   Smokeless tobacco: Never  Substance and Sexual Activity   Alcohol use: Yes    Comment: occasional glass of wine   Drug use: No   Sexual activity: Not on file  Other Topics Concern   Not on file  Social History Narrative   Not on file   Social Determinants of Health   Financial Resource Strain: Low Risk     Difficulty of Paying Living Expenses: Not hard at all  Food Insecurity: No Food Insecurity   Worried About Charity fundraiser in the Last Year: Never true   [place] in the Last Year: Never true  Transportation Needs: No Transportation Needs   Lack of Transportation (Medical): No   Lack of Transportation (Non-Medical): No  Physical Activity: Sufficiently Active   Days of Exercise per Week: 7 days   Minutes of Exercise per Session: 30 min  Stress: No Stress Concern Present   Feeling of Stress : Not at all  Social Connections: Not on file    Tobacco Counseling Counseling given: Not Answered   Clinical Intake:                 Diabetic? No         Activities of Daily Living In your present state of health, do you have any difficulty performing the following activities: 05/06/2021 07/21/2020  Hearing? N N  Vision? N N  Difficulty concentrating or making decisions? N N  Walking or climbing stairs? N N  Dressing or bathing? N N  Doing errands, shopping? N N  Preparing Food and eating ? - N  Using the Toilet? - N  In the past six months, have you accidently leaked urine? - N  Do you have problems with loss of bowel control? - N  Managing your Medications? - N  Managing your Finances? - N  Housekeeping or managing your Housekeeping? - N  Some recent data might be hidden    Patient Care Team: Tower, Wynelle Fanny, MD as PCP - General  Indicate any recent Medical Services you may have received from other than [facility] providers in the past year (date may be approximate).     Assessment:   This is a routine wellness examination for Yaak.  Hearing/Vision screen No results found.  Dietary issues and exercise activities discussed:     Goals Addressed   None    Depression Screen PHQ 2/9 Scores 07/21/2020 03/03/2019 09/19/2017  PHQ - 2 Score 0 0 1  PHQ- 9 Score 0 - -    Fall Risk Fall Risk  07/21/2020 06/10/2019 03/03/2019 09/19/2017  Falls in the past year? 0  0 0 No  Comment - Emmi Telephone Survey: data to providers prior to load - -  Number falls in past yr: 0 - 0 -  Injury with Fall? 0 - 0 -  Risk for fall due to : Medication side effect - - -  Follow up Falls evaluation completed;Falls prevention discussed - Falls evaluation completed -    FALL RISK PREVENTION PERTAINING TO THE HOME:  Any stairs in or around the home? {YES/NO:21197} If so, are there any without handrails? {YES/NO:21197} Home free of loose throw rugs in walkways, pet beds, electrical cords, etc? {YES/NO:21197} Adequate lighting in your home to reduce risk of falls? {YES/NO:21197}  ASSISTIVE DEVICES UTILIZED TO PREVENT FALLS:  Life  alert? {YES/NO:21197} Use of a cane, walker or w/c? {YES/NO:21197} Grab bars in the bathroom? {YES/NO:21197} Shower chair or bench in shower? {YES/NO:21197} Elevated toilet seat or a handicapped toilet? {YES/NO:21197}  TIMED UP AND GO:  Was the test performed? No .    Cognitive Function: MMSE - Mini Mental State Exam 07/21/2020  Orientation to time 5  Orientation to Place 5  Registration 3  Attention/ Calculation 5  Recall 3  Language- repeat 1        Immunizations Immunization History  Administered Date(s) Administered   Influenza, High Dose Seasonal PF 05/06/2019   Influenza-Unspecified 08/03/2017, 06/16/2018, 04/27/2020   PFIZER(Purple Top)SARS-COV-2 Vaccination 08/28/2019, 09/18/2019, 04/27/2020   Pneumococcal Conjugate-13 09/19/2017   Pneumococcal Polysaccharide-23 03/03/2019   Td 12/22/2003   Tdap 04/15/2014, 07/26/2017   Zoster Recombinat (Shingrix) 09/21/2017, 12/28/2017   Zoster, Live 05/01/2012    TDAP status: Up to date  {Flu Vaccine status:2101806}  Pneumococcal vaccine status: Up to date  {Covid-19 vaccine status:2101808}  Qualifies for Shingles Vaccine? Yes   Zostavax completed Yes   Shingrix Completed?: Yes  Screening Tests Health Maintenance  Topic Date Due   COVID-19 Vaccine (4 - Booster for  Pfizer series) 06/22/2020   INFLUENZA VACCINE  02/14/2021   Hepatitis C Screening  09/20/2027 (Originally 03/22/1969)   MAMMOGRAM  11/24/2021   COLONOSCOPY (Pts 45-7yrs Insurance coverage will need to be confirmed)  09/05/2026   TETANUS/TDAP  07/27/2027   Pneumonia Vaccine 53+ Years old  Completed   DEXA SCAN  Completed   Zoster Vaccines- Shingrix  Completed   HPV VACCINES  Aged Out    Health Maintenance  Health Maintenance Due  Topic Date Due   COVID-19 Vaccine (4 - Booster for Pfizer series) 06/22/2020   INFLUENZA VACCINE  02/14/2021    Colorectal cancer screening: Type of screening: Colonoscopy. Completed 09/05/16. Repeat every 10 years  Mammogram status: Completed 11/24/20. Repeat every year  Bone Density status: Completed 11/24/20. Results reflect: Bone density results: OSTEOPENIA. Repeat every 2 years.  Lung Cancer Screening: (Low Dose CT Chest recommended if Age 4-80 years, 30 pack-year currently smoking OR have quit w/in 15years.) does not qualify.     Additional Screening:  Hepatitis C Screening: does qualify  Vision Screening: Recommended annual ophthalmology exams for early detection of glaucoma and other disorders of the eye. Is the patient up to date with their annual eye exam?  {YES/NO:21197} Who is the provider or what is the name of the office in which the patient attends annual eye exams? *** If pt is not established with a provider, would they like to be referred to a provider to establish care? {YES/NO:21197}.   Dental Screening: Recommended annual dental exams for proper oral hygiene  Community Resource Referral / Chronic Care Management: CRR required this visit?  {YES/NO:21197}  CCM required this visit?  {YES/NO:21197}     Plan:     I have personally reviewed and noted the following in the patients chart:   Medical and social history Use of alcohol, tobacco or illicit drugs  Current medications and supplements including opioid prescriptions.   Functional ability and status Nutritional status Physical activity Advanced directives List of other physicians Hospitalizations, surgeries, and ER visits in previous 12 months Vitals Screenings to include cognitive, depression, and falls Referrals and appointments  In addition, I have reviewed and discussed with patient certain preventive protocols, quality metrics, and best practice recommendations. A written personalized care plan for preventive services as well as general preventive health recommendations were  provided to patient.   Due to this being a telephonic visit, the after visit summary with patients personalized plan was offered to patient via mail or my-chart. ***Patient declined at this time./ Patient would like to access on my-chart/ per request, patient was mailed a copy of AVS./ Patient preferred to pick up at office at next visit.   Loma Messing, LPN   07/21/7260   Nurse Health Advisor  Nurse Notes: none

## 2021-07-22 ENCOUNTER — Ambulatory Visit: Payer: Federal, State, Local not specified - PPO

## 2021-09-24 ENCOUNTER — Other Ambulatory Visit: Payer: Self-pay | Admitting: Family Medicine

## 2021-10-22 ENCOUNTER — Other Ambulatory Visit: Payer: Self-pay | Admitting: Family Medicine

## 2021-10-27 ENCOUNTER — Encounter: Payer: Self-pay | Admitting: Family Medicine

## 2021-10-27 ENCOUNTER — Ambulatory Visit: Payer: Federal, State, Local not specified - PPO | Admitting: Family Medicine

## 2021-10-27 VITALS — BP 130/74 | HR 67 | Temp 98.0°F | Ht 66.0 in | Wt 166.5 lb

## 2021-10-27 DIAGNOSIS — K219 Gastro-esophageal reflux disease without esophagitis: Secondary | ICD-10-CM

## 2021-10-27 DIAGNOSIS — E538 Deficiency of other specified B group vitamins: Secondary | ICD-10-CM

## 2021-10-27 DIAGNOSIS — L84 Corns and callosities: Secondary | ICD-10-CM | POA: Diagnosis not present

## 2021-10-27 DIAGNOSIS — M5416 Radiculopathy, lumbar region: Secondary | ICD-10-CM | POA: Diagnosis not present

## 2021-10-27 MED ORDER — LANSOPRAZOLE 30 MG PO CPDR: 30.0000 mg | DELAYED_RELEASE_CAPSULE | Freq: Two times a day (BID) | ORAL | 3 refills | Status: DC

## 2021-10-27 MED ORDER — CELECOXIB 200 MG PO CAPS: 200.0000 mg | ORAL_CAPSULE | Freq: Every day | ORAL | 1 refills | Status: DC | PRN

## 2021-10-27 NOTE — Patient Instructions (Signed)
You will get a call for a podiatrist referral  ? ?Use a corn pad  ?Soaking is fine  ? ?Take celebrex for a month daily with food  ?This is for back /leg pain and radicular symptoms  ? ?Let us know how you are in a month /earlier if it worsens  ?

## 2021-10-27 NOTE — Assessment & Plan Note (Addendum)
Reviewed last note here and imaging and note from ortho/Dr Mack Guise  ?Suspect this is also cause of numb feeling  ?She did improve originally with steroid pack and then celebrex (was on for shoulder )  ? ?Will re start celebrex 200 mg daily for a mo with food (on ppi)  ?Given rehab exercises for LS /sciatica  ?Discussed care with lifting and position  ?If no improvement or rebound after tx consider MRI and f/u with ortho ?

## 2021-10-27 NOTE — Assessment & Plan Note (Signed)
Medial MTP area/over bunion  ?Adv use of corn pad ?Also wide shoes  ?Soaking prn  ?Ref to podiatry  ? ?

## 2021-10-27 NOTE — Assessment & Plan Note (Signed)
Lab Results  ?Component Value Date  ? [id number] 641 01/18/2021  ? ?Takes 1000 mcg daily oral  ? ?Doubt cause of LE paresthesia  ?

## 2021-10-27 NOTE — Progress Notes (Signed)
? ?Subjective:  ? ? Patient ID: Caroline Francis, female    DOB: 1951-05-12, 71 y.o.   MRN: [id number] ? ?HPI ?Pt presents for R foot pain and L foot/leg numb feeling  ? ?Wt Readings from Last 3 Encounters:  ?10/27/21 166 lb 8 oz (75.5 kg)  ?05/10/21 162 lb (73.5 kg)  ?05/06/21 162 lb 8 oz (73.7 kg)  ? ?26.87 kg/m? ? ? ?Had a shoulder replacement in the fall/doing very well  ? ?Discussed L leg pain a year ago and some calf cramps  ?Had nl vascular eval  ?LS and hip films , bilat OA of hips noted and mild diffuse deg disc dz ?Was ref to ortho and dx with lumbar radiculopathy and given medrol pack  ? ?It improved for a while and then got worse again  ?Numb at times  ? ? ?Now other side is starting to bother her  ?R foot is numb at times also  ?Has a painful corn also  ? ?Lab Results  ?Component Value Date  ? [id number] 641 01/18/2021  ? ?Still taking that  ?Fish oil ?Tumeric  ? ?Lab Results  ?Component Value Date  ? CREATININE 0.71 05/06/2021  ? BUN 13 05/06/2021  ? NA 134 (L) 05/06/2021  ? K 3.7 05/06/2021  ? CL 97 (L) 05/06/2021  ? CO2 28 05/06/2021  ? ? ? ?Was on celebrex for a while after shoulder surgery  ?Tramadol helped/now off of this and oxycodone  ? ?[place] the beach on vacation  ?Would like to be able to walk  ? ?Patient Active Problem List  ? Diagnosis Date Noted  ? Corn of foot 10/27/2021  ? Lumbar radicular pain 10/27/2021  ? Left leg paresthesias 12/16/2020  ? Left leg pain 10/13/2020  ? Left groin pain 10/13/2020  ? Pain in limb 10/05/2020  ? Vitamin B12 deficiency 07/29/2020  ? Vitamin D deficiency 07/29/2020  ? Current use of proton pump inhibitor 07/28/2020  ? Screening mammogram, encounter for 03/12/2019  ? Medicare annual wellness visit, initial 03/03/2019  ? Osteopenia 11/04/2017  ? Welcome to Medicare preventive visit 09/19/2017  ? Estrogen deficiency 09/19/2017  ? Elevated glucose level 09/19/2017  ? Osteoarthritis of hip 07/26/2015  ? Pain in left hip 07/23/2015  ? External hemorrhoids  04/09/2012  ? Routine general medical examination at a health care facility 04/02/2012  ? SNORING, HX OF 05/04/2010  ? Hyperthyroidism 03/31/2010  ? Hyperlipidemia 12/25/2007  ? Essential hypertension 12/25/2007  ? GERD 12/25/2007  ? HIATAL HERNIA 12/25/2007  ? ?Past Medical History:  ?Diagnosis Date  ? GERD (gastroesophageal reflux disease)   ? Hyperlipidemia   ? Hypertension   ? Low TSH level   ? ?Past Surgical History:  ?Procedure Laterality Date  ? ABDOMINAL HYSTERECTOMY    ? partial /bleeding  ? NASAL SEPTUM SURGERY    ? REVERSE SHOULDER ARTHROPLASTY Right 05/10/2021  ? Procedure: REVERSE SHOULDER ARTHROPLASTY;  Surgeon: Corky Mull, MD;  Location: [facility] ORS;  Service: Orthopedics;  Laterality: Right;  ? ?Social History  ? ?Tobacco Use  ? Smoking status: Never  ? Smokeless tobacco: Never  ?Substance Use Topics  ? Alcohol use: Yes  ?  Comment: occasional glass of wine  ? Drug use: No  ? ?History reviewed. No pertinent family history. ?Allergies  ?Allergen Reactions  ? Lisinopril Cough  ? ?Current Outpatient Medications on File Prior to Visit  ?Medication Sig Dispense Refill  ? hydrochlorothiazide (HYDRODIURIL) 25 MG tablet TAKE 1 TABLET (25  MG TOTAL) BY MOUTH DAILY. 30 tablet 0  ? olmesartan (BENICAR) 40 MG tablet TAKE 1 TABLET BY MOUTH EVERY DAY 30 tablet 0  ? Omega-3 Fatty Acids (FISH OIL) 500 MG CAPS Take 500 mg by mouth daily.    ? OVER THE COUNTER MEDICATION Apply 1 application topically daily as needed (muscle/joint pain). Cool azul topical gel    ? TURMERIC CURCUMIN PO Take 1 capsule by mouth daily.    ? vitamin B-12 (CYANOCOBALAMIN) 1000 MCG tablet Take 1,000 mcg by mouth daily.    ? ?No current facility-administered medications on file prior to visit.  ?  ?Review of Systems  ?Constitutional:  Negative for activity change, appetite change, fatigue, fever and unexpected weight change.  ?HENT:  Negative for congestion, ear pain, rhinorrhea, sinus pressure and sore throat.   ?Eyes:  Negative for pain,  redness and visual disturbance.  ?Respiratory:  Negative for cough, shortness of breath and wheezing.   ?Cardiovascular:  Negative for chest pain and palpitations.  ?Gastrointestinal:  Negative for abdominal pain, blood in stool, constipation and diarrhea.  ?Endocrine: Negative for polydipsia and polyuria.  ?Genitourinary:  Negative for dysuria, frequency and urgency.  ?Musculoskeletal:  Positive for arthralgias and back pain. Negative for gait problem, joint swelling and myalgias.  ?Skin:  Negative for pallor and rash.  ?Allergic/Immunologic: Negative for environmental allergies.  ?Neurological:  Positive for numbness. Negative for dizziness, syncope, weakness and headaches.  ?Hematological:  Negative for adenopathy. Does not bruise/bleed easily.  ?Psychiatric/Behavioral:  Negative for decreased concentration and dysphoric mood. The patient is not nervous/anxious.   ? ?   ?Objective:  ? Physical Exam ?Constitutional:   ?   General: She is not in acute distress. ?   Appearance: Normal appearance. She is normal weight. She is not ill-appearing.  ?Eyes:  ?   General: No scleral icterus. ?   Conjunctiva/sclera: Conjunctivae normal.  ?   Pupils: Pupils are equal, round, and reactive to light.  ?Neck:  ?   Vascular: No carotid bruit.  ?Cardiovascular:  ?   Rate and Rhythm: Normal rate and regular rhythm.  ?   Heart sounds: Normal heart sounds.  ?Pulmonary:  ?   Effort: Pulmonary effort is normal. No respiratory distress.  ?   Breath sounds: Normal breath sounds.  ?Musculoskeletal:  ?   Cervical back: Neck supple.  ?   Right lower leg: No edema.  ?   Left lower leg: No edema.  ?   Comments: No LS tenderness ?Some back pain with ext  ?Neg SLR  ? ?Nl gait  ? ? ? ? ?R foot medial bunion deformity with corn   ?Lymphadenopathy:  ?   Cervical: No cervical adenopathy.  ?Skin: ?   General: Skin is warm and dry.  ?   Findings: No lesion or rash.  ?   Comments: Corn in med MTP area R foot  ?Tender  ?Small core  ?No s/s of infection   ? ?tanned  ?Neurological:  ?   Mental Status: She is alert.  ?   Sensory: No sensory deficit.  ?   Motor: No weakness.  ?   Coordination: Coordination normal.  ?   Deep Tendon Reflexes: Reflexes normal.  ?   Comments: No acute sensory or motor change of LEs today  ? ?  ?Psychiatric:     ?   Mood and Affect: Mood normal.  ? ? ? ? ? ?   ?Assessment & Plan:  ? ?Problem List Items  Addressed This Visit   ? ?  ? Nervous and Auditory  ? Lumbar radicular pain - Primary  ?  Reviewed last note here and imaging and note from ortho/Dr Mack Guise  ?Suspect this is also cause of numb feeling  ?She did improve originally with steroid pack and then celebrex (was on for shoulder )  ? ?Will re start celebrex 200 mg daily for a mo with food (on ppi)  ?Given rehab exercises for LS /sciatica  ?Discussed care with lifting and position  ?If no improvement or rebound after tx consider MRI and f/u with ortho ?  ?  ?  ? Musculoskeletal and Integument  ? Corn of foot  ?  Medial MTP area/over bunion  ?Adv use of corn pad ?Also wide shoes  ?Soaking prn  ?Ref to podiatry  ? ?  ?  ? Relevant Orders  ? Ambulatory referral to Podiatry  ?  ? Other  ? Vitamin B12 deficiency  ?  Lab Results  ?Component Value Date  ? [id number] 641 01/18/2021  ?Takes 1000 mcg daily oral  ? ?Doubt cause of LE paresthesia  ?  ?  ?  ?

## 2021-10-27 NOTE — Assessment & Plan Note (Signed)
Refilled prevacid bid  ? ?For GERD and also GI protection from celebrex ?

## 2021-11-16 ENCOUNTER — Other Ambulatory Visit: Payer: Self-pay | Admitting: Family Medicine

## 2021-11-16 NOTE — Telephone Encounter (Signed)
Pt had a recent acute appt but no recent f/u or CPE appt, please advise  ?

## 2021-11-16 NOTE — Telephone Encounter (Signed)
Please schedule f/u appt.

## 2021-11-17 NOTE — Telephone Encounter (Signed)
Letter mailed letting pt know ?

## 2021-12-11 ENCOUNTER — Telehealth: Payer: Self-pay | Admitting: Family Medicine

## 2021-12-13 NOTE — Telephone Encounter (Signed)
I refilled once  Please schedule annual exam

## 2021-12-13 NOTE — Telephone Encounter (Signed)
Pt had a recent acute appt but no recent f/u or CPE appts and no future appts., please advise

## 2021-12-15 NOTE — Telephone Encounter (Signed)
Letter mailed letting pt know ?

## 2021-12-19 NOTE — Telephone Encounter (Signed)
Pt is scheduled for 12/27/21

## 2021-12-27 ENCOUNTER — Encounter: Payer: Medicare Other | Admitting: Family Medicine

## 2022-01-31 ENCOUNTER — Ambulatory Visit (INDEPENDENT_AMBULATORY_CARE_PROVIDER_SITE_OTHER): Payer: Medicare Other

## 2022-01-31 VITALS — Wt 166.0 lb

## 2022-01-31 DIAGNOSIS — Z1231 Encounter for screening mammogram for malignant neoplasm of breast: Secondary | ICD-10-CM

## 2022-01-31 DIAGNOSIS — Z0000 Encounter for general adult medical examination without abnormal findings: Secondary | ICD-10-CM

## 2022-01-31 NOTE — Patient Instructions (Signed)
[name] , Thank you for taking time to come for your Medicare Wellness Visit. I appreciate your ongoing commitment to your health goals. Please review the following plan we discussed and let me know if I can assist you in the future.   Screening recommendations/referrals: Colonoscopy: 09/05/16 Mammogram: 11/24/20, referral sent Bone Density: 11/24/20 Recommended yearly ophthalmology/optometry visit for glaucoma screening and checkup Recommended yearly dental visit for hygiene and checkup  Vaccinations: Influenza vaccine: n/d Pneumococcal vaccine: 10/07/21 Tdap vaccine: 07/26/17 Shingles vaccine: Zostavax 05/01/12  Shingrix 09/21/17, 12/28/17   Covid-19:08/28/19, 09/18/19, 04/27/20, 12/24/20, 10/07/21  Advanced directives: no  Conditions/risks identified: none  Next appointment: Follow up in one year for your annual wellness visit 02/02/23 @ 8:45 am by phone   Preventive Care 65 Years and Older, Female Preventive care refers to lifestyle choices and visits with your health care provider that can promote health and wellness. What does preventive care include? A yearly physical exam. This is also called an annual well check. Dental exams once or twice a year. Routine eye exams. Ask your health care provider how often you should have your eyes checked. Personal lifestyle choices, including: Daily care of your teeth and gums. Regular physical activity. Eating a healthy diet. Avoiding tobacco and drug use. Limiting alcohol use. Practicing safe sex. Taking low-dose aspirin every day. Taking vitamin and mineral supplements as recommended by your health care provider. What happens during an annual well check? The services and screenings done by your health care provider during your annual well check will depend on your age, overall health, lifestyle risk factors, and family history of disease. Counseling  Your health care provider may ask you questions about your: Alcohol use. Tobacco  use. Drug use. Emotional well-being. Home and relationship well-being. Sexual activity. Eating habits. History of falls. Memory and ability to understand (cognition). Work and work Statistician. Reproductive health. Screening  You may have the following tests or measurements: Height, weight, and BMI. Blood pressure. Lipid and cholesterol levels. These may be checked every 5 years, or more frequently if you are over 56 years old. Skin check. Lung cancer screening. You may have this screening every year starting at age 34 if you have a 30-pack-year history of smoking and currently smoke or have quit within the past 15 years. Fecal occult blood test (FOBT) of the stool. You may have this test every year starting at age 87. Flexible sigmoidoscopy or colonoscopy. You may have a sigmoidoscopy every 5 years or a colonoscopy every 10 years starting at age 7. Hepatitis C blood test. Hepatitis B blood test. Sexually transmitted disease (STD) testing. Diabetes screening. This is done by checking your blood sugar (glucose) after you have not eaten for a while (fasting). You may have this done every 1-3 years. Bone density scan. This is done to screen for osteoporosis. You may have this done starting at age 4. Mammogram. This may be done every 1-2 years. Talk to your health care provider about how often you should have regular mammograms. Talk with your health care provider about your test results, treatment options, and if necessary, the need for more tests. Vaccines  Your health care provider may recommend certain vaccines, such as: Influenza vaccine. This is recommended every year. Tetanus, diphtheria, and acellular pertussis (Tdap, Td) vaccine. You may need a Td booster every 10 years. Zoster vaccine. You may need this after age 46. Pneumococcal 13-valent conjugate (PCV13) vaccine. One dose is recommended after age 84. Pneumococcal polysaccharide (PPSV23) vaccine. One dose is  recommended  after age 70. Talk to your health care provider about which screenings and vaccines you need and how often you need them. This information is not intended to replace advice given to you by your health care provider. Make sure you discuss any questions you have with your health care provider. Document Released: 07/30/2015 Document Revised: 03/22/2016 Document Reviewed: 05/04/2015 Elsevier Interactive Patient Education  2017 [place] Prevention in the Home Falls can cause injuries. They can happen to people of all ages. There are many things you can do to make your home safe and to help prevent falls. What can I do on the outside of my home? Regularly fix the edges of walkways and driveways and fix any cracks. Remove anything that might make you trip as you walk through a door, such as a raised step or threshold. Trim any bushes or trees on the path to your home. Use bright outdoor lighting. Clear any walking paths of anything that might make someone trip, such as rocks or tools. Regularly check to see if handrails are loose or broken. Make sure that both sides of any steps have handrails. Any raised decks and porches should have guardrails on the edges. Have any leaves, snow, or ice cleared regularly. Use sand or salt on walking paths during winter. Clean up any spills in your garage right away. This includes oil or grease spills. What can I do in the bathroom? Use night lights. Install grab bars by the toilet and in the tub and shower. Do not use towel bars as grab bars. Use non-skid mats or decals in the tub or shower. If you need to sit down in the shower, use a plastic, non-slip stool. Keep the floor dry. Clean up any water that spills on the floor as soon as it happens. Remove soap buildup in the tub or shower regularly. Attach bath mats securely with double-sided non-slip rug tape. Do not have throw rugs and other things on the floor that can make you trip. What can I do  in the bedroom? Use night lights. Make sure that you have a light by your bed that is easy to reach. Do not use any sheets or blankets that are too big for your bed. They should not hang down onto the floor. Have a firm chair that has side arms. You can use this for support while you get dressed. Do not have throw rugs and other things on the floor that can make you trip. What can I do in the kitchen? Clean up any spills right away. Avoid walking on wet floors. Keep items that you use a lot in easy-to-reach places. If you need to reach something above you, use a strong step stool that has a grab bar. Keep electrical cords out of the way. Do not use floor polish or wax that makes floors slippery. If you must use wax, use non-skid floor wax. Do not have throw rugs and other things on the floor that can make you trip. What can I do with my stairs? Do not leave any items on the stairs. Make sure that there are handrails on both sides of the stairs and use them. Fix handrails that are broken or loose. Make sure that handrails are as long as the stairways. Check any carpeting to make sure that it is firmly attached to the stairs. Fix any carpet that is loose or worn. Avoid having throw rugs at the top or bottom of the stairs. If  you do have throw rugs, attach them to the floor with carpet tape. Make sure that you have a light switch at the top of the stairs and the bottom of the stairs. If you do not have them, ask someone to add them for you. What else can I do to help prevent falls? Wear shoes that: Do not have high heels. Have rubber bottoms. Are comfortable and fit you well. Are closed at the toe. Do not wear sandals. If you use a stepladder: Make sure that it is fully opened. Do not climb a closed stepladder. Make sure that both sides of the stepladder are locked into place. Ask someone to hold it for you, if possible. Clearly mark and make sure that you can see: Any grab bars or  handrails. First and last steps. Where the edge of each step is. Use tools that help you move around (mobility aids) if they are needed. These include: Canes. Walkers. Scooters. Crutches. Turn on the lights when you go into a dark area. Replace any light bulbs as soon as they burn out. Set up your furniture so you have a clear path. Avoid moving your furniture around. If any of your floors are uneven, fix them. If there are any pets around you, be aware of where they are. Review your medicines with your doctor. Some medicines can make you feel dizzy. This can increase your chance of falling. Ask your doctor what other things that you can do to help prevent falls. This information is not intended to replace advice given to you by your health care provider. Make sure you discuss any questions you have with your health care provider. Document Released: 04/29/2009 Document Revised: 12/09/2015 Document Reviewed: 08/07/2014 Elsevier Interactive Patient Education  2017 [employer].

## 2022-01-31 NOTE — Progress Notes (Signed)
Virtual Visit via Telephone Note  I connected with  Caroline Francis on 01/31/22 at  8:45 AM EDT by telephone and verified that I am speaking with the correct person using two identifiers.  Location: Patient: home Provider: [place] Persons participating in the virtual visit: [place]   I discussed the limitations, risks, security and privacy concerns of performing an evaluation and management service by telephone and the availability of in person appointments. The patient expressed understanding and agreed to proceed.  Interactive audio and video telecommunications were attempted between this nurse and patient, however failed, due to patient having technical difficulties OR patient did not have access to video capability.  We continued and completed visit with audio only.  Some vital signs may be absent or patient reported.   Dionisio David, LPN  Subjective:   Caroline Francis is a 71 y.o. female who presents for Medicare Annual (Subsequent) preventive examination.  Review of Systems     Cardiac Risk Factors include: advanced age (>34mn, >>40women);hypertension     Objective:    There were no vitals filed for this visit. There is no height or weight on file to calculate BMI.     01/31/2022    8:53 AM 05/10/2021    8:45 AM 05/06/2021    8:38 AM 07/21/2020    8:57 AM  Advanced Directives  Does Patient Have a Medical Advance Directive? No Yes Yes Yes  Type of ACorporate treasurerof [place] will [place] will [place] will  Does patient want to make changes to medical advance directive?   No - Patient declined   Copy of [place]?   No - copy requested No - copy requested  Would patient like information on creating a medical advance directive? No - Patient declined       Current Medications (verified) Outpatient Encounter Medications as of  01/31/2022  Medication Sig   chlorhexidine (PERIDEX) 0.12 % solution USE TWICE DAILY AS INSTRUCTED   cholecalciferol (VITAMIN D3) 25 MCG (1000 UNIT) tablet Take 1,000 Units by mouth daily.   hydrochlorothiazide (HYDRODIURIL) 25 MG tablet TAKE 1 TABLET (25 MG TOTAL) BY MOUTH DAILY.   lansoprazole (PREVACID) 30 MG capsule Take 1 capsule (30 mg total) by mouth 2 (two) times daily.   mupirocin cream (BACTROBAN) 2 % MIX TO TAKE 255 G BY MOUTH ONCE FOR 1 DOSE. AS DIRECTED   olmesartan (BENICAR) 40 MG tablet TAKE 1 TABLET BY MOUTH EVERY DAY   olmesartan-hydrochlorothiazide (BENICAR HCT) 40-25 MG tablet    Omega-3 Fatty Acids (FISH OIL) 500 MG CAPS Take 500 mg by mouth daily.   OVER THE COUNTER MEDICATION Apply 1 application topically daily as needed (muscle/joint pain). Cool azul topical gel   TURMERIC CURCUMIN PO Take 1 capsule by mouth daily.   vitamin B-12 (CYANOCOBALAMIN) 1000 MCG tablet Take 1,000 mcg by mouth daily.   celecoxib (CELEBREX) 200 MG capsule Take 1 capsule (200 mg total) by mouth daily as needed (back/leg pain). With food (Patient not taking: Reported on 01/31/2022)   fluticasone (FLOVENT HFA) 220 MCG/ACT inhaler  (Patient not taking: Reported on 01/31/2022)   meloxicam (MOBIC) 15 MG tablet  (Patient not taking: Reported on 01/31/2022)   traMADol (ULTRAM) 50 MG tablet Take 1 tablet by mouth every 8 (eight) hours as needed. (Patient not taking: Reported on 01/31/2022)   No facility-administered encounter medications on file as of 01/31/2022.  Allergies (verified) Lisinopril   History: Past Medical History:  Diagnosis Date   GERD (gastroesophageal reflux disease)    Hyperlipidemia    Hypertension    Low TSH level    Past Surgical History:  Procedure Laterality Date   ABDOMINAL HYSTERECTOMY     partial /bleeding   NASAL SEPTUM SURGERY     REVERSE SHOULDER ARTHROPLASTY Right 05/10/2021   Procedure: REVERSE SHOULDER ARTHROPLASTY;  Surgeon: Corky Mull, MD;  Location: [facility]  ORS;  Service: Orthopedics;  Laterality: Right;   History reviewed. No pertinent family history. Social History   Socioeconomic History   Marital status: Divorced    Spouse name: Not on file   Number of children: Not on file   Years of education: Not on file   Highest education level: Not on file  Occupational History   Not on file  Tobacco Use   Smoking status: Never   Smokeless tobacco: Never  Substance and Sexual Activity   Alcohol use: Yes    Comment: occasional glass of wine   Drug use: No   Sexual activity: Not on file  Other Topics Concern   Not on file  Social History Narrative   Not on file   Social Determinants of Health   Financial Resource Strain: Low Risk  (01/31/2022)   Overall Financial Resource Strain (CARDIA)    Difficulty of Paying Living Expenses: Not hard at all  Food Insecurity: No Food Insecurity (01/31/2022)   Hunger Vital Sign    Worried About Running Out of Food in the Last Year: Never true    [place] in the Last Year: Never true  Transportation Needs: No Transportation Needs (01/31/2022)   PRAPARE - Hydrologist (Medical): No    Lack of Transportation (Non-Medical): No  Physical Activity: Sufficiently Active (01/31/2022)   Exercise Vital Sign    Days of Exercise per Week: 3 days    Minutes of Exercise per Session: 60 min  Stress: No Stress Concern Present (01/31/2022)   [place]    Feeling of Stress : Not at all  Social Connections: Socially Isolated (01/31/2022)   Social Connection and Isolation Panel [NHANES]    Frequency of Communication with Friends and Family: More than three times a week    Frequency of Social Gatherings with Friends and Family: More than three times a week    Attends Religious Services: Never    Marine scientist or Organizations: No    Attends Music therapist: Never    Marital Status: Divorced     Tobacco Counseling Counseling given: Not Answered   Clinical Intake:  Pre-visit preparation completed: Yes  Pain : No/denies pain     Diabetes: No  How often do you need to have someone help you when you read instructions, pamphlets, or other written materials from your doctor or pharmacy?: 1 - Never  Diabetic?no  Interpreter Needed?: No  Information entered by :: Kirke Shaggy, LPN   Activities of Daily Living    01/31/2022    8:53 AM 05/06/2021    8:42 AM  In your present state of health, do you have any difficulty performing the following activities:  Hearing? 0   Vision? 0   Difficulty concentrating or making decisions? 0   Walking or climbing stairs? 0   Dressing or bathing? 0   Doing errands, shopping? 0 0  Preparing Food and eating ? N  Using the Toilet? N   In the past six months, have you accidently leaked urine? N   Do you have problems with loss of bowel control? N   Managing your Medications? N   Managing your Finances? N   Housekeeping or managing your Housekeeping? N     Patient Care Team: Tower, Wynelle Fanny, MD as PCP - General  Indicate any recent Medical Services you may have received from other than [facility] providers in the past year (date may be approximate).     Assessment:   This is a routine wellness examination for Caroline Francis.  Hearing/Vision screen Hearing Screening - Comments:: No aids Vision Screening - Comments:: Wears contacts- [facility]  Dietary issues and exercise activities discussed: Current Exercise Habits: Structured exercise class, Type of exercise: Other - see comments (swimming), Time (Minutes): 60, Frequency (Times/Week): 3, Weekly Exercise (Minutes/Week): 180, Intensity: Moderate   Goals Addressed             This Visit's Progress    DIET - EAT MORE FRUITS AND VEGETABLES         Depression Screen    01/31/2022    8:51 AM 07/21/2020    8:58 AM 03/03/2019    3:23 PM 09/19/2017    5:41 PM  PHQ 2/9 Scores   PHQ - 2 Score 0 0 0 1  PHQ- 9 Score 0 0      Fall Risk    01/31/2022    8:53 AM 07/21/2020    8:58 AM 06/10/2019    9:44 AM 03/03/2019    3:23 PM 09/19/2017    5:41 PM  Fall Risk   Falls in the past year? 0 0 0 0 No  Comment   Emmi Telephone Survey: data to providers prior to load    Number falls in past yr: 0 0  0   Injury with Fall? 0 0  0   Risk for fall due to : No Fall Risks Medication side effect     Follow up Falls evaluation completed Falls evaluation completed;Falls prevention discussed  Falls evaluation completed     FALL RISK PREVENTION PERTAINING TO THE HOME:  Any stairs in or around the home? Yes  If so, are there any without handrails? No  Home free of loose throw rugs in walkways, pet beds, electrical cords, etc? Yes  Adequate lighting in your home to reduce risk of falls? Yes   ASSISTIVE DEVICES UTILIZED TO PREVENT FALLS:  Life alert? No  Use of a cane, walker or w/c? No  Grab bars in the bathroom? No  Shower chair or bench in shower? Yes  Elevated toilet seat or a handicapped toilet? No    Cognitive Function:    07/21/2020    8:59 AM  MMSE - Mini Mental State Exam  Orientation to time 5  Orientation to Place 5  Registration 3  Attention/ Calculation 5  Recall 3  Language- repeat 1        01/31/2022    8:55 AM  6CIT Screen  What Year? 0 points  What month? 0 points  What time? 0 points  Count back from 20 0 points  Months in reverse 0 points  Repeat phrase 0 points  Total Score 0 points    Immunizations Immunization History  Administered Date(s) Administered   Influenza, High Dose Seasonal PF 05/06/2019   Influenza-Unspecified 08/03/2017, 06/16/2018, 04/27/2020   Moderna SARS-COV2 Booster Vaccination 10/07/2021, 10/07/2021   Moderna Sars-Covid-2 Vaccination 12/24/2020  PFIZER(Purple Top)SARS-COV-2 Vaccination 08/28/2019, 09/18/2019, 04/27/2020   PNEUMOCOCCAL CONJUGATE-20 10/07/2021   Pneumococcal Conjugate-13 09/19/2017   Pneumococcal  Polysaccharide-23 03/03/2019   Td 12/22/2003   Tdap 04/15/2014, 07/26/2017   Zoster Recombinat (Shingrix) 09/21/2017, 12/28/2017   Zoster, Live 05/01/2012    TDAP status: Up to date  Flu Vaccine status: Declined, Education has been provided regarding the importance of this vaccine but patient still declined. Advised may receive this vaccine at local pharmacy or Health Dept. Aware to provide a copy of the vaccination record if obtained from local pharmacy or Health Dept. Verbalized acceptance and understanding.  Pneumococcal vaccine status: Up to date  Covid-19 vaccine status: Completed vaccines  Qualifies for Shingles Vaccine? Yes   Zostavax completed Yes   Shingrix Completed?: Yes  Screening Tests Health Maintenance  Topic Date Due   MAMMOGRAM  11/24/2021   COVID-19 Vaccine (5 - Booster for Pfizer series) 12/02/2021   Hepatitis C Screening  09/20/2027 (Originally 03/22/1969)   INFLUENZA VACCINE  02/14/2022   COLONOSCOPY (Pts 45-52yr Insurance coverage will need to be confirmed)  09/05/2026   TETANUS/TDAP  07/27/2027   Pneumonia Vaccine 71 Years old  Completed   DEXA SCAN  Completed   Zoster Vaccines- Shingrix  Completed   HPV VACCINES  Aged Out    Health Maintenance  Health Maintenance Due  Topic Date Due   MAMMOGRAM  11/24/2021   COVID-19 Vaccine (5 - Booster for [place]) 12/02/2021    Colorectal cancer screening: Type of screening: Colonoscopy. Completed 09/05/16. Repeat every 10 years  Mammogram status: Ordered today. Pt provided with contact info and advised to call to schedule appt.   Bone Density status: Completed 11/24/20. Results reflect: Bone density results: OSTEOPENIA. Repeat every 5 years.  Lung Cancer Screening: (Low Dose CT Chest recommended if Age 71-80years, 30 pack-year currently smoking OR have quit w/in 15years.) does not qualify.    Additional Screening:  Hepatitis C Screening: does qualify; Completed no  Vision Screening: Recommended  annual ophthalmology exams for early detection of glaucoma and other disorders of the eye. Is the patient up to date with their annual eye exam?  Yes  Who is the provider or what is the name of the office in which the patient attends annual eye exams? [facility] not [facility] with a provider, would they like to be referred to a provider to establish care? No .   Dental Screening: Recommended annual dental exams for proper oral hygiene  Community Resource Referral / Chronic Care Management: CRR required this visit?  No   CCM required this visit?  No      Plan:     I have personally reviewed and noted the following in the patient's chart:   Medical and social history Use of alcohol, tobacco or illicit drugs  Current medications and supplements including opioid prescriptions.  Functional ability and status Nutritional status Physical activity Advanced directives List of other physicians Hospitalizations, surgeries, and ER visits in previous 12 months Vitals Screenings to include cognitive, depression, and falls Referrals and appointments  In addition, I have reviewed and discussed with patient certain preventive protocols, quality metrics, and best practice recommendations. A written personalized care plan for preventive services as well as general preventive health recommendations were provided to patient.     LDionisio David LPN   [id number]  Nurse Notes: none

## 2022-03-17 ENCOUNTER — Other Ambulatory Visit: Payer: Self-pay | Admitting: Family Medicine

## 2022-03-17 NOTE — Telephone Encounter (Signed)
Pt is overdue for her CPE (cancelled last one). Please schedule appt before I refill meds. Thanks

## 2022-03-17 NOTE — Telephone Encounter (Signed)
Patient scheduled.

## 2022-03-29 ENCOUNTER — Telehealth: Payer: Self-pay | Admitting: Family Medicine

## 2022-03-29 DIAGNOSIS — E7800 Pure hypercholesterolemia, unspecified: Secondary | ICD-10-CM

## 2022-03-29 DIAGNOSIS — E559 Vitamin D deficiency, unspecified: Secondary | ICD-10-CM

## 2022-03-29 DIAGNOSIS — R7309 Other abnormal glucose: Secondary | ICD-10-CM

## 2022-03-29 DIAGNOSIS — E538 Deficiency of other specified B group vitamins: Secondary | ICD-10-CM

## 2022-03-29 DIAGNOSIS — I10 Essential (primary) hypertension: Secondary | ICD-10-CM

## 2022-03-29 NOTE — Telephone Encounter (Signed)
-----   Message from Ellamae Sia sent at 03/24/2022  4:04 PM EDT ----- Regarding: Lab orders for Thursday, 9.14.23 Patient is scheduled for CPX labs, please order future labs, Thanks , Karna Christmas

## 2022-03-30 ENCOUNTER — Other Ambulatory Visit (INDEPENDENT_AMBULATORY_CARE_PROVIDER_SITE_OTHER): Payer: Medicare Other

## 2022-03-30 DIAGNOSIS — E7800 Pure hypercholesterolemia, unspecified: Secondary | ICD-10-CM

## 2022-03-30 DIAGNOSIS — R7309 Other abnormal glucose: Secondary | ICD-10-CM

## 2022-03-30 DIAGNOSIS — E538 Deficiency of other specified B group vitamins: Secondary | ICD-10-CM | POA: Diagnosis not present

## 2022-03-30 DIAGNOSIS — I10 Essential (primary) hypertension: Secondary | ICD-10-CM | POA: Diagnosis not present

## 2022-03-30 DIAGNOSIS — E559 Vitamin D deficiency, unspecified: Secondary | ICD-10-CM | POA: Diagnosis not present

## 2022-03-30 LAB — CBC WITH DIFFERENTIAL/PLATELET
Basophils Absolute: 0.1 10*3/uL (ref 0.0–0.1)
Basophils Relative: 1.2 % (ref 0.0–3.0)
Eosinophils Absolute: 0.3 10*3/uL (ref 0.0–0.7)
Eosinophils Relative: 5.9 % — ABNORMAL HIGH (ref 0.0–5.0)
HCT: 43.8 % (ref 36.0–46.0)
Hemoglobin: 14.9 g/dL (ref 12.0–15.0)
Lymphocytes Relative: 30.5 % (ref 12.0–46.0)
Lymphs Abs: 1.6 10*3/uL (ref 0.7–4.0)
MCHC: 34.1 g/dL (ref 30.0–36.0)
MCV: 98.9 fl (ref 78.0–100.0)
Monocytes Absolute: 0.4 10*3/uL (ref 0.1–1.0)
Monocytes Relative: 7.5 % (ref 3.0–12.0)
Neutro Abs: 2.9 10*3/uL (ref 1.4–7.7)
Neutrophils Relative %: 54.9 % (ref 43.0–77.0)
Platelets: 259 10*3/uL (ref 150.0–400.0)
RBC: 4.42 Mil/uL (ref 3.87–5.11)
RDW: 13.2 % (ref 11.5–15.5)
WBC: 5.4 10*3/uL (ref 4.0–10.5)

## 2022-03-30 LAB — COMPREHENSIVE METABOLIC PANEL
ALT: 17 U/L (ref 0–35)
AST: 17 U/L (ref 0–37)
Albumin: 4.2 g/dL (ref 3.5–5.2)
Alkaline Phosphatase: 72 U/L (ref 39–117)
BUN: 15 mg/dL (ref 6–23)
CO2: 28 mEq/L (ref 19–32)
Calcium: 9.5 mg/dL (ref 8.4–10.5)
Chloride: 97 mEq/L (ref 96–112)
Creatinine, Ser: 0.81 mg/dL (ref 0.40–1.20)
GFR: 73.24 mL/min (ref >60.00)
Glucose, Bld: 93 mg/dL (ref 70–99)
Potassium: 4.7 mEq/L (ref 3.5–5.1)
Sodium: 136 mEq/L (ref 135–145)
Total Bilirubin: 0.6 mg/dL (ref 0.2–1.2)
Total Protein: 6.5 g/dL (ref 6.0–8.3)

## 2022-03-30 LAB — LIPID PANEL
Cholesterol: 226 mg/dL — ABNORMAL HIGH (ref 0–200)
HDL: 71.3 mg/dL (ref >39.00)
LDL Cholesterol: 130 mg/dL — ABNORMAL HIGH (ref 0–99)
NonHDL: 154.61
Total CHOL/HDL Ratio: 3
Triglycerides: 123 mg/dL (ref 0.0–149.0)
VLDL: 24.6 mg/dL (ref 0.0–40.0)

## 2022-03-30 LAB — VITAMIN B12: Vitamin B-12: 920 pg/mL — ABNORMAL HIGH (ref 211–911)

## 2022-03-30 LAB — HEMOGLOBIN A1C: Hgb A1c MFr Bld: 5.2 % (ref 4.6–6.5)

## 2022-03-30 LAB — TSH: TSH: 1.72 u[IU]/mL (ref 0.35–5.50)

## 2022-03-30 LAB — VITAMIN D 25 HYDROXY (VIT D DEFICIENCY, FRACTURES): VITD: 83.55 ng/mL (ref 30.00–100.00)

## 2022-04-05 ENCOUNTER — Ambulatory Visit
Admission: RE | Admit: 2022-04-05 | Discharge: 2022-04-05 | Disposition: A | Discharge status: Home / Self Care | Payer: Medicare Other | Source: Ambulatory Visit | Attending: Family Medicine | Admitting: Family Medicine

## 2022-04-05 DIAGNOSIS — Z1231 Encounter for screening mammogram for malignant neoplasm of breast: Secondary | ICD-10-CM

## 2022-04-06 ENCOUNTER — Encounter: Payer: Medicare Other | Admitting: Family Medicine

## 2022-05-03 ENCOUNTER — Encounter: Payer: Medicare Other | Admitting: Family Medicine

## 2022-05-09 ENCOUNTER — Encounter: Payer: Self-pay | Admitting: Family Medicine

## 2022-05-09 ENCOUNTER — Ambulatory Visit (INDEPENDENT_AMBULATORY_CARE_PROVIDER_SITE_OTHER): Payer: Medicare Other | Admitting: Family Medicine

## 2022-05-09 VITALS — BP 116/78 | HR 75 | Temp 98.0°F | Ht 66.0 in | Wt 163.2 lb

## 2022-05-09 DIAGNOSIS — Z86010 Personal history of colonic polyps: Secondary | ICD-10-CM | POA: Diagnosis not present

## 2022-05-09 DIAGNOSIS — R1310 Dysphagia, unspecified: Secondary | ICD-10-CM | POA: Diagnosis not present

## 2022-05-09 DIAGNOSIS — R7309 Other abnormal glucose: Secondary | ICD-10-CM

## 2022-05-09 DIAGNOSIS — M8580 Other specified disorders of bone density and structure, unspecified site: Secondary | ICD-10-CM

## 2022-05-09 DIAGNOSIS — Z0000 Encounter for general adult medical examination without abnormal findings: Secondary | ICD-10-CM | POA: Diagnosis not present

## 2022-05-09 DIAGNOSIS — E559 Vitamin D deficiency, unspecified: Secondary | ICD-10-CM | POA: Diagnosis not present

## 2022-05-09 DIAGNOSIS — I10 Essential (primary) hypertension: Secondary | ICD-10-CM | POA: Diagnosis not present

## 2022-05-09 DIAGNOSIS — E0590 Thyrotoxicosis, unspecified without thyrotoxic crisis or storm: Secondary | ICD-10-CM

## 2022-05-09 DIAGNOSIS — Z79899 Other long term (current) drug therapy: Secondary | ICD-10-CM

## 2022-05-09 DIAGNOSIS — Z23 Encounter for immunization: Secondary | ICD-10-CM

## 2022-05-09 DIAGNOSIS — K219 Gastro-esophageal reflux disease without esophagitis: Secondary | ICD-10-CM

## 2022-05-09 DIAGNOSIS — E7800 Pure hypercholesterolemia, unspecified: Secondary | ICD-10-CM

## 2022-05-09 DIAGNOSIS — E538 Deficiency of other specified B group vitamins: Secondary | ICD-10-CM

## 2022-05-09 NOTE — Assessment & Plan Note (Signed)
Reviewed health habits including diet and exercise and skin cancer prevention Reviewed appropriate screening tests for age  Also reviewed health mt list, fam hx and immunization status , as well as social and family history   See HPI Labs reviewed  Flu shot given  Mammogram 03/2022  dexa 11/2020 up to date, no falls or fx and good exercise

## 2022-05-09 NOTE — Patient Instructions (Addendum)
Your next bone density test is due after 11/25/22  Call us to order it when time  Call your insurance before you schedule it also   You can cut your B12 pill by 1/2   Take care of yourself   Keep exercising  Use sun protection   Flu shot today

## 2022-05-09 NOTE — Assessment & Plan Note (Signed)
dexa 11/2020  No falls or fractures  Vit D level is tx 83.5  Good exercise-enc to add some more strength training   Due after 11/25/22

## 2022-05-09 NOTE — Assessment & Plan Note (Signed)
Lab Results  Component Value Date   [id number] (H) 03/30/2022   inst to cut her B12 in 1/2 or take every other day

## 2022-05-09 NOTE — Assessment & Plan Note (Signed)
Lab Results  Component Value Date   HGBA1C 5.2 03/30/2022   Reassuring disc imp of low glycemic diet and wt loss to prevent DM2

## 2022-05-09 NOTE — Assessment & Plan Note (Signed)
TSH is stable  Lab Results  Component Value Date   TSH 1.72 03/30/2022

## 2022-05-09 NOTE — Progress Notes (Signed)
Subjective:    Patient ID: Caroline Francis, female    DOB: [birth year], 71 y.o.   MRN: [id number]  HPI Here for health maintenance exam and to review chronic medical problems    Wt Readings from Last 3 Encounters:  05/09/22 163 lb 4 oz (74 kg)  01/31/22 166 lb (75.3 kg)  10/27/21 166 lb 8 oz (75.5 kg)   26.35 kg/m  Has been feeling great  Taking care of herself   Exercise  Swim class 2 days per week  Some physical work at home- heavy yard work     Immunization History  Administered Date(s) Administered   Influenza, High Dose Seasonal PF 05/06/2019   Influenza-Unspecified 08/03/2017, 06/16/2018, 04/27/2020   Moderna SARS-COV2 Booster Vaccination 10/07/2021, 10/07/2021   Moderna Sars-Covid-2 Vaccination 12/24/2020   PFIZER(Purple Top)SARS-COV-2 Vaccination 08/28/2019, 09/18/2019, 04/27/2020   PNEUMOCOCCAL CONJUGATE-20 10/07/2021   Pneumococcal Conjugate-13 09/19/2017   Pneumococcal Polysaccharide-23 03/03/2019   Td 12/22/2003   Tdap 04/15/2014, 07/26/2017   Zoster Recombinat (Shingrix) 09/21/2017, 12/28/2017   Zoster, Live 05/01/2012   Health Maintenance Due  Topic Date Due   COVID-19 Vaccine (5 - Pfizer risk series) 12/02/2021   INFLUENZA VACCINE  02/14/2022   Flu shot given today   Mammogram 03/2022  Self breast exam: no lumps   Colonoscopy 08/2016 with 5 y recall  Has to schedule that-in process of that   Dexa  11/2020-osteopenia   Falls: none  Fractures:none  Supplements vit d  Vit D level 83.5  Exercise - swimming/working and walking (when she does not swim)   HTN bp is stable today  No cp or palpitations or headaches or edema  No side effects to medicines  BP Readings from Last 3 Encounters:  05/09/22 116/78  10/27/21 130/74  05/10/21 117/72    Hctz 25 mg daily  Benicar 40 mg daily   GERD -prevacid 30 mg bid as needed - not always every day   No longer taking celebrex   Takes B12 1000 mcg daily  Lab Results  Component Value Date    VITAMINB12 920 (H) 03/30/2022    Past hyperthyroidism Lab Results  Component Value Date   TSH 1.72 03/30/2022   Elevated glucose Lab Results  Component Value Date   HGBA1C 5.2 03/30/2022    Trying to eat healthy  Avoids red meat  Eats some fish when she can   Not really eating less sugar but not a lot to begin with    Hyperlipidemia Lab Results  Component Value Date   CHOL 226 (H) 03/30/2022   CHOL 248 (H) 07/28/2020   CHOL 258 (H) 03/03/2019   Lab Results  Component Value Date   HDL 71.30 03/30/2022   HDL 78.10 07/28/2020   HDL 68.70 03/03/2019   Lab Results  Component Value Date   LDLCALC 130 (H) 03/30/2022   LDLCALC 133 (H) 07/28/2020   LDLCALC 127 (H) 09/06/2017   Lab Results  Component Value Date   TRIG 123.0 03/30/2022   TRIG 185.0 (H) 07/28/2020   TRIG (H) 03/03/2019    412.0 Triglyceride is over 400; calculations on Lipids are invalid.   Lab Results  Component Value Date   CHOLHDL 3 03/30/2022   CHOLHDL 3 07/28/2020   CHOLHDL 4 03/03/2019   Lab Results  Component Value Date   LDLDIRECT 148.0 03/03/2019   LDLDIRECT 144.2 04/15/2014   LDLDIRECT 144.7 04/03/2012   No cholesterol medicine   Patient Active Problem List   Diagnosis Date Noted  Corn of foot 10/27/2021   Lumbar radicular pain 10/27/2021   Status post reverse total shoulder replacement, right 05/11/2021   Rotator cuff arthropathy of right shoulder 03/25/2021   Vitamin B12 deficiency 07/29/2020   Vitamin D deficiency 07/29/2020   Current use of proton pump inhibitor 07/28/2020   Screening mammogram, encounter for 03/12/2019   Medicare annual wellness visit, initial 03/03/2019   Osteopenia 11/04/2017   Welcome to Medicare preventive visit 09/19/2017   Estrogen deficiency 09/19/2017   Elevated glucose level 09/19/2017   Osteoarthritis of hip 07/26/2015   External hemorrhoids 04/09/2012   Routine general medical examination at a health care facility 04/02/2012   SNORING, HX  OF 05/04/2010   Hyperthyroidism 03/31/2010   Hyperlipidemia 12/25/2007   Essential hypertension 12/25/2007   GERD 12/25/2007   HIATAL HERNIA 12/25/2007   Past Medical History:  Diagnosis Date   GERD (gastroesophageal reflux disease)    Hyperlipidemia    Hypertension    Low TSH level    Past Surgical History:  Procedure Laterality Date   ABDOMINAL HYSTERECTOMY     partial /bleeding   NASAL SEPTUM SURGERY     REVERSE SHOULDER ARTHROPLASTY Right 05/10/2021   Procedure: REVERSE SHOULDER ARTHROPLASTY;  Surgeon: Corky Mull, MD;  Location: [facility] ORS;  Service: Orthopedics;  Laterality: Right;   Social History   Tobacco Use   Smoking status: Never   Smokeless tobacco: Never  Substance Use Topics   Alcohol use: Yes    Comment: occasional glass of wine   Drug use: No   History reviewed. No pertinent family history. Allergies  Allergen Reactions   Lisinopril Cough   Current Outpatient Medications on File Prior to Visit  Medication Sig Dispense Refill   cholecalciferol (VITAMIN D3) 25 MCG (1000 UNIT) tablet Take 1,000 Units by mouth daily.     lansoprazole (PREVACID) 30 MG capsule Take 1 capsule (30 mg total) by mouth 2 (two) times daily. (Patient taking differently: Take 30 mg by mouth 2 (two) times daily as needed.) 180 capsule 3   olmesartan-hydrochlorothiazide (BENICAR HCT) 40-25 MG tablet      Omega-3 Fatty Acids (FISH OIL) 500 MG CAPS Take 500 mg by mouth daily.     TURMERIC CURCUMIN PO Take 1 capsule by mouth daily.     vitamin B-12 (CYANOCOBALAMIN) 1000 MCG tablet Take 1,000 mcg by mouth daily.     No current facility-administered medications on file prior to visit.      Review of Systems  Constitutional:  Negative for activity change, appetite change, fatigue, fever and unexpected weight change.  HENT:  Negative for congestion, ear pain, rhinorrhea, sinus pressure and sore throat.   Eyes:  Negative for pain, redness and visual disturbance.  Respiratory:  Negative  for cough, shortness of breath and wheezing.   Cardiovascular:  Negative for chest pain and palpitations.  Gastrointestinal:  Negative for abdominal pain, blood in stool, constipation and diarrhea.  Endocrine: Negative for polydipsia and polyuria.  Genitourinary:  Negative for dysuria, frequency and urgency.  Musculoskeletal:  Negative for arthralgias, back pain and myalgias.  Skin:  Negative for pallor and rash.  Allergic/Immunologic: Negative for environmental allergies.  Neurological:  Negative for dizziness, syncope and headaches.  Hematological:  Negative for adenopathy. Does not bruise/bleed easily.  Psychiatric/Behavioral:  Negative for decreased concentration and dysphoric mood. The patient is not nervous/anxious.        Objective:   Physical Exam Constitutional:      General: She is not in acute distress.  Appearance: Normal appearance. She is well-developed and normal weight. She is not ill-appearing or diaphoretic.  HENT:     Head: Normocephalic and atraumatic.     Right Ear: Tympanic membrane, ear canal and external ear normal.     Left Ear: Tympanic membrane, ear canal and external ear normal.     Nose: Nose normal. No congestion.     Mouth/Throat:     Mouth: Mucous membranes are moist.     Pharynx: Oropharynx is clear. No posterior oropharyngeal erythema.  Eyes:     General: No scleral icterus.    Extraocular Movements: Extraocular movements intact.     Conjunctiva/sclera: Conjunctivae normal.     Pupils: Pupils are equal, round, and reactive to light.  Neck:     Thyroid: No thyromegaly.     Vascular: No carotid bruit or JVD.  Cardiovascular:     Rate and Rhythm: Normal rate and regular rhythm.     Pulses: Normal pulses.     Heart sounds: Normal heart sounds.     No gallop.  Pulmonary:     Effort: Pulmonary effort is normal. No respiratory distress.     Breath sounds: Normal breath sounds. No wheezing.     Comments: Good air exch Chest:     Chest wall: No  tenderness.  Abdominal:     General: Bowel sounds are normal. There is no distension or abdominal bruit.     Palpations: Abdomen is soft. There is no mass.     Tenderness: There is no abdominal tenderness.     Hernia: No hernia is present.  Genitourinary:    Comments: Breast exam: No mass, nodules, thickening, tenderness, bulging, retraction, inflamation, nipple discharge or skin changes noted.  No axillary or clavicular LA.     Musculoskeletal:        General: No tenderness. Normal range of motion.     Cervical back: Normal range of motion and neck supple. No rigidity. No muscular tenderness.     Right lower leg: No edema.     Left lower leg: No edema.     Comments: No kyphosis   Lymphadenopathy:     Cervical: No cervical adenopathy.  Skin:    General: Skin is warm and dry.     Coloration: Skin is not pale.     Findings: No erythema or rash.     Comments: Solar lentigines diffusely Tanned,mildly   Neurological:     Mental Status: She is alert. Mental status is at baseline.     Cranial Nerves: No cranial nerve deficit.     Motor: No abnormal muscle tone.     Coordination: Coordination normal.     Gait: Gait normal.     Deep Tendon Reflexes: Reflexes are normal and symmetric. Reflexes normal.  Psychiatric:        Mood and Affect: Mood normal.        Cognition and Memory: Cognition and memory normal.           Assessment & Plan:   Problem List Items Addressed This Visit       Cardiovascular and Mediastinum   Essential hypertension    bp in fair control at this time  BP Readings from Last 1 Encounters:  05/09/22 116/78  No changes needed Most recent labs reviewed  Disc lifstyle change with low sodium diet and exercise  Plan to continue hctz 25 mg daily  benicar 40 mg daily          Digestive  GERD    Pt takes prevacid prn now-using less  This is reassuring  Enc to watch her diet for triggers         Endocrine   Hyperthyroidism    TSH is stable  Lab  Results  Component Value Date   TSH 1.72 03/30/2022           Musculoskeletal and Integument   Osteopenia    dexa 11/2020  No falls or fractures  Vit D level is tx 83.5  Good exercise-enc to add some more strength training   Due after 11/25/22          Other   Current use of proton pump inhibitor    Now only taking ppi prn      Elevated glucose level    Lab Results  Component Value Date   HGBA1C 5.2 03/30/2022  Reassuring disc imp of low glycemic diet and wt loss to prevent DM2       Hyperlipidemia    Disc goals for lipids and reasons to control them Rev last labs with pt Rev low sat fat diet in detail Stable  Good HDL Goal LDL is under 100  If this worsens consider statin  Trig are nl now with better diet       Routine general medical examination at a health care facility - Primary    Reviewed health habits including diet and exercise and skin cancer prevention Reviewed appropriate screening tests for age  Also reviewed health mt list, fam hx and immunization status , as well as social and family history   See HPI Labs reviewed  Flu shot given  Mammogram 03/2022  dexa 11/2020 up to date, no falls or fx and good exercise        Vitamin B12 deficiency    Lab Results  Component Value Date   VITAMINB12 920 (H) 03/30/2022  inst to cut her B12 in 1/2 or take every other day      Vitamin D deficiency    Vitamin D level is therapeutic with current supplementation Disc importance of this to bone and overall health       Other Visit Diagnoses     Need for influenza vaccination       Relevant Orders   Flu Vaccine QUAD High Dose(Fluad) (Completed)

## 2022-05-09 NOTE — Assessment & Plan Note (Signed)
Pt takes prevacid prn now-using less  This is reassuring  Enc to watch her diet for triggers

## 2022-05-09 NOTE — Assessment & Plan Note (Signed)
Now only taking ppi prn

## 2022-05-09 NOTE — Assessment & Plan Note (Signed)
Disc goals for lipids and reasons to control them Rev last labs with pt Rev low sat fat diet in detail Stable  Good HDL Goal LDL is under 100  If this worsens consider statin  Trig are nl now with better diet

## 2022-05-09 NOTE — Assessment & Plan Note (Signed)
bp in fair control at this time  BP Readings from Last 1 Encounters:  05/09/22 116/78   No changes needed Most recent labs reviewed  Disc lifstyle change with low sodium diet and exercise  Plan to continue hctz 25 mg daily  benicar 40 mg daily

## 2022-05-09 NOTE — Assessment & Plan Note (Signed)
Vitamin D level is therapeutic with current supplementation Disc importance of this to bone and overall health  

## 2022-06-09 ENCOUNTER — Other Ambulatory Visit: Payer: Self-pay | Admitting: Family Medicine

## 2022-06-23 DIAGNOSIS — R0683 Snoring: Secondary | ICD-10-CM | POA: Diagnosis not present

## 2022-06-23 DIAGNOSIS — J3489 Other specified disorders of nose and nasal sinuses: Secondary | ICD-10-CM | POA: Diagnosis not present

## 2022-06-23 DIAGNOSIS — J310 Chronic rhinitis: Secondary | ICD-10-CM | POA: Diagnosis not present

## 2022-08-17 DIAGNOSIS — D124 Benign neoplasm of descending colon: Secondary | ICD-10-CM | POA: Diagnosis not present

## 2022-08-17 DIAGNOSIS — K222 Esophageal obstruction: Secondary | ICD-10-CM | POA: Diagnosis not present

## 2022-08-17 DIAGNOSIS — K640 First degree hemorrhoids: Secondary | ICD-10-CM | POA: Diagnosis not present

## 2022-08-17 DIAGNOSIS — K317 Polyp of stomach and duodenum: Secondary | ICD-10-CM | POA: Diagnosis not present

## 2022-08-17 DIAGNOSIS — D122 Benign neoplasm of ascending colon: Secondary | ICD-10-CM | POA: Diagnosis not present

## 2022-08-17 DIAGNOSIS — D120 Benign neoplasm of cecum: Secondary | ICD-10-CM | POA: Diagnosis not present

## 2022-08-17 DIAGNOSIS — Z1211 Encounter for screening for malignant neoplasm of colon: Secondary | ICD-10-CM | POA: Diagnosis not present

## 2022-08-17 DIAGNOSIS — Z86010 Personal history of colonic polyps: Secondary | ICD-10-CM | POA: Diagnosis not present

## 2022-08-17 DIAGNOSIS — D123 Benign neoplasm of transverse colon: Secondary | ICD-10-CM | POA: Diagnosis not present

## 2022-08-17 DIAGNOSIS — K449 Diaphragmatic hernia without obstruction or gangrene: Secondary | ICD-10-CM | POA: Diagnosis not present

## 2022-08-18 ENCOUNTER — Encounter
Admission: RE | Disposition: A | Discharge status: Home / Self Care | Payer: Self-pay | Source: Ambulatory Visit | Attending: Gastroenterology

## 2022-08-18 ENCOUNTER — Encounter: Payer: Self-pay | Admitting: *Deleted

## 2022-08-18 ENCOUNTER — Ambulatory Visit: Payer: Medicare Other | Admitting: Certified Registered"

## 2022-08-18 ENCOUNTER — Ambulatory Visit
Admission: RE | Admit: 2022-08-18 | Discharge: 2022-08-18 | Disposition: A | Discharge status: Home / Self Care | Payer: Medicare Other | Source: Ambulatory Visit | Attending: Gastroenterology | Admitting: Gastroenterology

## 2022-08-18 DIAGNOSIS — R1310 Dysphagia, unspecified: Secondary | ICD-10-CM | POA: Diagnosis not present

## 2022-08-18 DIAGNOSIS — K640 First degree hemorrhoids: Secondary | ICD-10-CM | POA: Insufficient documentation

## 2022-08-18 DIAGNOSIS — Z86010 Personal history of colonic polyps: Secondary | ICD-10-CM | POA: Diagnosis not present

## 2022-08-18 DIAGNOSIS — K222 Esophageal obstruction: Secondary | ICD-10-CM | POA: Insufficient documentation

## 2022-08-18 DIAGNOSIS — D122 Benign neoplasm of ascending colon: Secondary | ICD-10-CM | POA: Insufficient documentation

## 2022-08-18 DIAGNOSIS — D126 Benign neoplasm of colon, unspecified: Secondary | ICD-10-CM | POA: Diagnosis not present

## 2022-08-18 DIAGNOSIS — I10 Essential (primary) hypertension: Secondary | ICD-10-CM | POA: Insufficient documentation

## 2022-08-18 DIAGNOSIS — D120 Benign neoplasm of cecum: Secondary | ICD-10-CM | POA: Diagnosis not present

## 2022-08-18 DIAGNOSIS — E785 Hyperlipidemia, unspecified: Secondary | ICD-10-CM | POA: Insufficient documentation

## 2022-08-18 DIAGNOSIS — K644 Residual hemorrhoidal skin tags: Secondary | ICD-10-CM | POA: Diagnosis not present

## 2022-08-18 DIAGNOSIS — K219 Gastro-esophageal reflux disease without esophagitis: Secondary | ICD-10-CM | POA: Insufficient documentation

## 2022-08-18 DIAGNOSIS — Z1211 Encounter for screening for malignant neoplasm of colon: Secondary | ICD-10-CM | POA: Diagnosis not present

## 2022-08-18 DIAGNOSIS — D124 Benign neoplasm of descending colon: Secondary | ICD-10-CM | POA: Diagnosis not present

## 2022-08-18 DIAGNOSIS — K449 Diaphragmatic hernia without obstruction or gangrene: Secondary | ICD-10-CM | POA: Insufficient documentation

## 2022-08-18 DIAGNOSIS — K317 Polyp of stomach and duodenum: Secondary | ICD-10-CM | POA: Insufficient documentation

## 2022-08-18 DIAGNOSIS — D123 Benign neoplasm of transverse colon: Secondary | ICD-10-CM | POA: Insufficient documentation

## 2022-08-18 DIAGNOSIS — K635 Polyp of colon: Secondary | ICD-10-CM | POA: Diagnosis not present

## 2022-08-18 HISTORY — PX: COLONOSCOPY WITH PROPOFOL: SHX5780

## 2022-08-18 HISTORY — PX: ESOPHAGOGASTRODUODENOSCOPY: SHX5428

## 2022-08-18 SURGERY — COLONOSCOPY WITH PROPOFOL
Anesthesia: General

## 2022-08-18 MED ORDER — LIDOCAINE 2% (20 MG/ML) 5 ML SYRINGE
INTRAMUSCULAR | Status: DC | PRN
  Administered 2022-08-18: 20 mg via INTRAVENOUS

## 2022-08-18 MED ORDER — PROPOFOL 500 MG/50ML IV EMUL
INTRAVENOUS | Status: DC | PRN
  Administered 2022-08-18: 120 ug/kg/min via INTRAVENOUS

## 2022-08-18 MED ORDER — PROPOFOL 10 MG/ML IV BOLUS
INTRAVENOUS | Status: DC | PRN
  Administered 2022-08-18: 30 mg via INTRAVENOUS
  Administered 2022-08-18: 70 mg via INTRAVENOUS

## 2022-08-18 MED ORDER — GLYCOPYRROLATE 0.2 MG/ML IJ SOLN
INTRAMUSCULAR | Status: DC | PRN
  Administered 2022-08-18: .2 mg via INTRAVENOUS

## 2022-08-18 MED ORDER — SODIUM CHLORIDE 0.9 % IV SOLN: INTRAVENOUS | Status: DC

## 2022-08-18 NOTE — Op Note (Signed)
[facility] Patient Name: Caroline Francis Procedure Date: 08/18/2022 10:58 AM MRN: [id number] Account #: [id number] Date of Birth: [birth year] Admit Type: Outpatient Age: 72 Room: [facility] ENDO ROOM 3 Gender: Female Note Status: Finalized Instrument Name: Altamese Cabal Endoscope [id number] Procedure:             Upper GI endoscopy Indications:           Dysphagia Providers:             Andrey Farmer MD, MD Referring MD:          Wynelle Fanny. Tower (Referring MD) Medicines:             Monitored Anesthesia Care Complications:         No immediate complications. Estimated blood loss:                         Minimal. Procedure:             Pre-Anesthesia Assessment:                        - Prior to the procedure, a History and Physical was                         performed, and patient medications and allergies were                         reviewed. The patient is competent. The risks and                         benefits of the procedure and the sedation options and                         risks were discussed with the patient. All questions                         were answered and informed consent was obtained.                         Patient identification and proposed procedure were                         verified by the physician, the nurse, the                         anesthesiologist, the anesthetist and the technician                         in the endoscopy suite. Mental Status Examination:                         alert and oriented. Airway Examination: normal                         oropharyngeal airway and neck mobility. Respiratory                         Examination: clear to auscultation. CV Examination:  normal. Prophylactic Antibiotics: The patient does not                         require prophylactic antibiotics. Prior                         Anticoagulants: The patient has taken no anticoagulant                         or  antiplatelet agents. ASA Grade Assessment: II - A                         patient with mild systemic disease. After reviewing                         the risks and benefits, the patient was deemed in                         satisfactory condition to undergo the procedure. The                         anesthesia plan was to use monitored anesthesia care                         (MAC). Immediately prior to administration of                         medications, the patient was re-assessed for adequacy                         to receive sedatives. The heart rate, respiratory                         rate, oxygen saturations, blood pressure, adequacy of                         pulmonary ventilation, and response to care were                         monitored throughout the procedure. The physical                         status of the patient was re-assessed after the                         procedure.                        After obtaining informed consent, the endoscope was                         passed under direct vision. Throughout the procedure,                         the patient's blood pressure, pulse, and oxygen                         saturations were monitored continuously. The Endoscope  was introduced through the mouth, and advanced to the                         second part of duodenum. The upper GI endoscopy was                         accomplished without difficulty. The patient tolerated                         the procedure well. Findings:      A non-obstructing Schatzki ring was found in the lower third of the       esophagus. A TTS dilator was passed through the scope. Dilation with a       15-16.5-18 mm balloon dilator was performed to 18 mm. The dilation site       was examined and showed moderate mucosal disruption. Estimated blood       loss was minimal.      A 3 cm hiatal hernia was present.      A few small sessile fundic gland polyps with no  bleeding and no stigmata       of recent bleeding were found in the gastric fundus and in the gastric       body.      The exam of the stomach was otherwise normal.      The examined duodenum was normal. Impression:            - Non-obstructing Schatzki ring. Dilated.                        - 3 cm hiatal hernia.                        - A few fundic gland polyps.                        - Normal examined duodenum.                        - No specimens collected. Recommendation:        - Discharge patient to home.                        - Resume previous diet.                        - Continue present medications.                        - Return to referring physician as previously                         scheduled. Procedure Code(s):     --- Professional ---                        [phone], Esophagogastroduodenoscopy, flexible,                         transoral; with transendoscopic balloon dilation of                         esophagus (less than 30  mm diameter) Diagnosis Code(s):     --- Professional ---                        K22.2, Esophageal obstruction                        K44.9, Diaphragmatic hernia without obstruction or                         gangrene                        K31.7, Polyp of stomach and duodenum                        R13.10, Dysphagia, unspecified CPT copyright 2022 [employer]. All rights reserved. The codes documented in this report are preliminary and upon coder review may  be revised to meet current compliance requirements. Andrey Farmer MD, MD 08/18/2022 11:56:03 AM Number of Addenda: 0 Note Initiated On: 08/18/2022 10:58 AM Estimated Blood Loss:  Estimated blood loss was minimal.      [facility]

## 2022-08-18 NOTE — Transfer of Care (Signed)
Immediate Anesthesia Transfer of Care Note  Patient: Caroline Francis  Procedure(s) Performed: COLONOSCOPY WITH PROPOFOL ESOPHAGOGASTRODUODENOSCOPY (EGD)  Patient Location: Endoscopy Unit  Anesthesia Type:General  Level of Consciousness: drowsy  Airway & Oxygen Therapy: Patient Spontanous Breathing  Post-op Assessment: Report given to RN and Post -op Vital signs reviewed and stable  Post vital signs: Reviewed  Last Vitals:  Vitals Value Taken Time  BP 139/96 08/18/22 1148  Temp 36 C 08/18/22 1148  Pulse 69 08/18/22 1149  Resp 13 08/18/22 1149  SpO2 97 % 08/18/22 1149  Vitals shown include unvalidated device data.  Last Pain:  Vitals:   08/18/22 1148  TempSrc: Temporal         Complications: No notable events documented.

## 2022-08-18 NOTE — Op Note (Signed)
[facility] Gastroenterology Patient Name: Caroline Francis Procedure Date: 08/18/2022 10:55 AM MRN: [id number] Account #: [id number] Date of Birth: 01/20/1951 Admit Type: Outpatient Age: 72 Room: [facility] ENDO ROOM 3 Gender: Female Note Status: Finalized Instrument Name: Park Meo [id number] Procedure:             Colonoscopy Indications:           Surveillance: Personal history of adenomatous polyps                         on last colonoscopy > 5 years ago Providers:             Andrey Farmer MD, MD Referring MD:          Wynelle Fanny. Tower (Referring MD) Medicines:             Monitored Anesthesia Care Complications:         No immediate complications. Estimated blood loss:                         Minimal. Procedure:             Pre-Anesthesia Assessment:                        - Prior to the procedure, a History and Physical was                         performed, and patient medications and allergies were                         reviewed. The patient is competent. The risks and                         benefits of the procedure and the sedation options and                         risks were discussed with the patient. All questions                         were answered and informed consent was obtained.                         Patient identification and proposed procedure were                         verified by the physician, the nurse, the                         anesthesiologist, the anesthetist and the technician                         in the endoscopy suite. Mental Status Examination:                         alert and oriented. Airway Examination: normal                         oropharyngeal airway and neck mobility. Respiratory  Examination: clear to auscultation. CV Examination:                         normal. Prophylactic Antibiotics: The patient does not                         require prophylactic antibiotics. Prior                          Anticoagulants: The patient has taken no anticoagulant                         or antiplatelet agents. ASA Grade Assessment: II - A                         patient with mild systemic disease. After reviewing                         the risks and benefits, the patient was deemed in                         satisfactory condition to undergo the procedure. The                         anesthesia plan was to use monitored anesthesia care                         (MAC). Immediately prior to administration of                         medications, the patient was re-assessed for adequacy                         to receive sedatives. The heart rate, respiratory                         rate, oxygen saturations, blood pressure, adequacy of                         pulmonary ventilation, and response to care were                         monitored throughout the procedure. The physical                         status of the patient was re-assessed after the                         procedure.                        After obtaining informed consent, the colonoscope was                         passed under direct vision. Throughout the procedure,                         the patient's blood pressure, pulse, and oxygen  saturations were monitored continuously. The                         Colonoscope was introduced through the anus and                         advanced to the the cecum, identified by appendiceal                         orifice and ileocecal valve. The colonoscopy was                         somewhat difficult due to significant looping.                         Successful completion of the procedure was aided by                         applying abdominal pressure. The patient tolerated the                         procedure well. The quality of the bowel preparation                         was good. The ileocecal valve, appendiceal orifice,                         and rectum  were photographed. Findings:      The perianal and digital rectal examinations were normal.      A 4 mm polyp was found in the cecum. The polyp was sessile. The polyp       was removed with a cold snare. Resection and retrieval were complete.       Estimated blood loss was minimal.      Six sessile polyps were found in the ascending colon. The polyps were 3       to 8 mm in size. These polyps were removed with a cold snare. Resection       and retrieval were complete. Estimated blood loss was minimal.      Two sessile polyps were found in the transverse colon. The polyps were 3       to 4 mm in size. These polyps were removed with a cold snare. Resection       and retrieval were complete. Estimated blood loss was minimal.      Two sessile polyps were found in the descending colon. The polyps were 1       to 2 mm in size. These polyps were removed with a jumbo cold forceps.       Resection and retrieval were complete. Estimated blood loss was minimal.      A 3 mm polyp was found in the descending colon. The polyp was sessile.       The polyp was removed with a cold snare. Resection and retrieval were       complete. Estimated blood loss was minimal.      Internal hemorrhoids were found during retroflexion. The hemorrhoids       were Grade I (internal hemorrhoids that do not prolapse).      The exam was otherwise without abnormality on direct and retroflexion  views. Impression:            - One 4 mm polyp in the cecum, removed with a cold                         snare. Resected and retrieved.                        - Six 3 to 8 mm polyps in the ascending colon, removed                         with a cold snare. Resected and retrieved.                        - Two 3 to 4 mm polyps in the transverse colon,                         removed with a cold snare. Resected and retrieved.                        - Two 1 to 2 mm polyps in the descending colon,                         removed with  a jumbo cold forceps. Resected and                         retrieved.                        - One 3 mm polyp in the descending colon, removed with                         a cold snare. Resected and retrieved.                        - Internal hemorrhoids.                        - The examination was otherwise normal on direct and                         retroflexion views. Recommendation:        - Discharge patient to home.                        - Resume previous diet.                        - Continue present medications.                        - Await pathology results.                        - Repeat colonoscopy in 1 year for surveillance.                        - Return to referring physician as previously  scheduled. Procedure Code(s):     --- Professional ---                        [phone], Colonoscopy, flexible; with removal of                         tumor(s), polyp(s), or other lesion(s) by snare                         technique                        [id number], 32, Colonoscopy, flexible; with biopsy, single                         or multiple Diagnosis Code(s):     --- Professional ---                        Z86.010, Personal history of colonic polyps                        D12.0, Benign neoplasm of cecum                        D12.4, Benign neoplasm of descending colon                        D12.2, Benign neoplasm of ascending colon                        D12.3, Benign neoplasm of transverse colon (hepatic                         flexure or splenic flexure)                        K64.0, First degree hemorrhoids CPT copyright 2022 American Medical Association. All rights reserved. The codes documented in this report are preliminary and upon coder review may  be revised to meet current compliance requirements. Andrey Farmer MD, MD 08/18/2022 11:51:56 AM Number of Addenda: 0 Note Initiated On: 08/18/2022 10:55 AM Scope Withdrawal Time: 0 hours 13 minutes  4 seconds  Total Procedure Duration: 0 hours 25 minutes 21 seconds  Estimated Blood Loss:  Estimated blood loss was minimal.      [facility]

## 2022-08-18 NOTE — Anesthesia Preprocedure Evaluation (Signed)
Anesthesia Evaluation  Patient identified by MRN, date of birth, ID band Patient awake    Reviewed: Allergy & Precautions, H&P , NPO status , Patient's Chart, lab work & pertinent test results, reviewed documented beta blocker date and time   History of Anesthesia Complications Negative for: history of anesthetic complications  Airway Mallampati: III  TM Distance: >3 FB Neck ROM: full    Dental  (+) Dental Advidsory Given, Caps, Teeth Intact   Pulmonary neg pulmonary ROS   Pulmonary exam normal breath sounds clear to auscultation       Cardiovascular Exercise Tolerance: Good hypertension, (-) angina (-) Past MI and (-) Cardiac Stents Normal cardiovascular exam(-) dysrhythmias (-) Valvular Problems/Murmurs Rhythm:regular Rate:Normal     Neuro/Psych negative neurological ROS  negative psych ROS   GI/Hepatic Neg liver ROS,GERD  ,,  Endo/Other  negative endocrine ROS    Renal/GU negative Renal ROS  negative genitourinary   Musculoskeletal   Abdominal   Peds  Hematology negative hematology ROS (+)   Anesthesia Other Findings Past Medical History: No date: GERD (gastroesophageal reflux disease) No date: Hyperlipidemia No date: Hypertension No date: Low TSH level   Reproductive/Obstetrics negative OB ROS                             Anesthesia Physical Anesthesia Plan  ASA: 2  Anesthesia Plan: General   Post-op Pain Management:    Induction: Intravenous  PONV Risk Score and Plan: 3 and Propofol infusion and TIVA  Airway Management Planned: Natural Airway and Nasal Cannula  Additional Equipment:   Intra-op Plan:   Post-operative Plan:   Informed Consent: I have reviewed the patients History and Physical, chart, labs and discussed the procedure including the risks, benefits and alternatives for the proposed anesthesia with the patient or authorized representative who has indicated  his/her understanding and acceptance.     Dental Advisory Given  Plan Discussed with: Anesthesiologist, CRNA and Surgeon  Anesthesia Plan Comments:        Anesthesia Quick Evaluation

## 2022-08-18 NOTE — Interval H&P Note (Signed)
History and Physical Interval Note:  08/18/2022 11:04 AM  Caroline Francis  has presented today for surgery, with the diagnosis of h/o TA Polyps dysphagia.  The various methods of treatment have been discussed with the patient and family. After consideration of risks, benefits and other options for treatment, the patient has consented to  Procedure(s): COLONOSCOPY WITH PROPOFOL (N/A) ESOPHAGOGASTRODUODENOSCOPY (EGD) (N/A) as a surgical intervention.  The patient's history has been reviewed, patient examined, no change in status, stable for surgery.  I have reviewed the patient's chart and labs.  Questions were answered to the patient's satisfaction.     Lesly Rubenstein  Ok to proceed with EGD/Colonoscopy

## 2022-08-18 NOTE — H&P (Signed)
Outpatient short stay form Pre-procedure 08/18/2022  Lesly Rubenstein, MD  Primary Physician: Tower, Wynelle Fanny, MD  Reason for visit:  Dysphagia/History of polyps  History of present illness:    72 y/o lady with history of hypertension, GERD, and HLD here for EGD/Colonoscopy for dysphagia to solids, mostly breads and history of adenomatous colon polyps. No blood thinners. No family history of GI malignancies. History of hysterectomy.    Current Facility-Administered Medications:    0.9 %  sodium chloride infusion, , Intravenous, Continuous, Bernadett Milian, Hilton Cork, MD, Last Rate: 20 mL/hr at 08/18/22 1017, New Bag at 08/18/22 1017  Medications Prior to Admission  Medication Sig Dispense Refill Last Dose   cholecalciferol (VITAMIN D3) 25 MCG (1000 UNIT) tablet Take 1,000 Units by mouth daily.   Past Week   hydrochlorothiazide (HYDRODIURIL) 25 MG tablet TAKE 1 TABLET (25 MG TOTAL) BY MOUTH DAILY. 90 tablet 2 08/17/2022   lansoprazole (PREVACID) 30 MG capsule Take 1 capsule (30 mg total) by mouth 2 (two) times daily. (Patient taking differently: Take 30 mg by mouth 2 (two) times daily as needed.) 180 capsule 3 Past Week   Omega-3 Fatty Acids (FISH OIL) 500 MG CAPS Take 500 mg by mouth daily.   Past Week   vitamin B-12 (CYANOCOBALAMIN) 1000 MCG tablet Take 1,000 mcg by mouth daily.   Past Week   olmesartan (BENICAR) 40 MG tablet TAKE 1 TABLET BY MOUTH EVERY DAY (Patient not taking: Reported on 08/18/2022) 90 tablet 2 Not Taking   TURMERIC CURCUMIN PO Take 1 capsule by mouth daily.        Allergies  Allergen Reactions   Lisinopril Cough     Past Medical History:  Diagnosis Date   GERD (gastroesophageal reflux disease)    Hyperlipidemia    Hypertension    Low TSH level     Review of systems:  Otherwise negative.    Physical Exam  Gen: Alert, oriented. Appears stated age.  HEENT: PERRLA. Lungs: No respiratory distress CV: RRR Abd: soft, benign, no masses Ext: No  edema    Planned procedures: Proceed with colonoscopy. The patient understands the nature of the planned procedure, indications, risks, alternatives and potential complications including but not limited to bleeding, infection, perforation, damage to internal organs and possible oversedation/side effects from anesthesia. The patient agrees and gives consent to proceed.  Please refer to procedure notes for findings, recommendations and patient disposition/instructions.     Lesly Rubenstein, MD [facility] Gastroenterology

## 2022-08-21 ENCOUNTER — Encounter: Payer: Self-pay | Admitting: Gastroenterology

## 2022-08-21 LAB — SURGICAL PATHOLOGY

## 2022-09-07 NOTE — Anesthesia Postprocedure Evaluation (Signed)
Anesthesia Post Note  Patient: Caroline Francis  Procedure(s) Performed: COLONOSCOPY WITH PROPOFOL ESOPHAGOGASTRODUODENOSCOPY (EGD)  Patient location during evaluation: Endoscopy Anesthesia Type: General Level of consciousness: awake and alert Pain management: pain level controlled Vital Signs Assessment: post-procedure vital signs reviewed and stable Respiratory status: spontaneous breathing, nonlabored ventilation, respiratory function stable and patient connected to nasal cannula oxygen Cardiovascular status: blood pressure returned to baseline and stable Postop Assessment: no apparent nausea or vomiting Anesthetic complications: no   No notable events documented.   Last Vitals:  Vitals:   08/18/22 1158 08/18/22 1208  BP: (!) 147/95 (!) 163/103  Pulse: 68 68  Resp: 16 19  Temp:    SpO2: 97% 98%    Last Pain:  Vitals:   08/19/22 1210  TempSrc:   PainSc: 0-No pain                 Martha Clan

## 2022-09-12 ENCOUNTER — Other Ambulatory Visit: Payer: Self-pay | Admitting: Family Medicine

## 2022-11-04 IMAGING — MG MM DIGITAL SCREENING BILAT W/ TOMO AND CAD
8 series · 8 of 24 positions shown · non-contrast
Comparison: Previous exam(s).

CLINICAL DATA: Screening.

EXAM:
DIGITAL SCREENING BILATERAL MAMMOGRAM WITH TOMOSYNTHESIS AND CAD
TECHNIQUE: Bilateral screening digital craniocaudal and mediolateral oblique
mammograms were obtained. Bilateral screening digital breast
tomosynthesis was performed. The images were evaluated with
computer-aided detection.

[L MLO synth-2D]
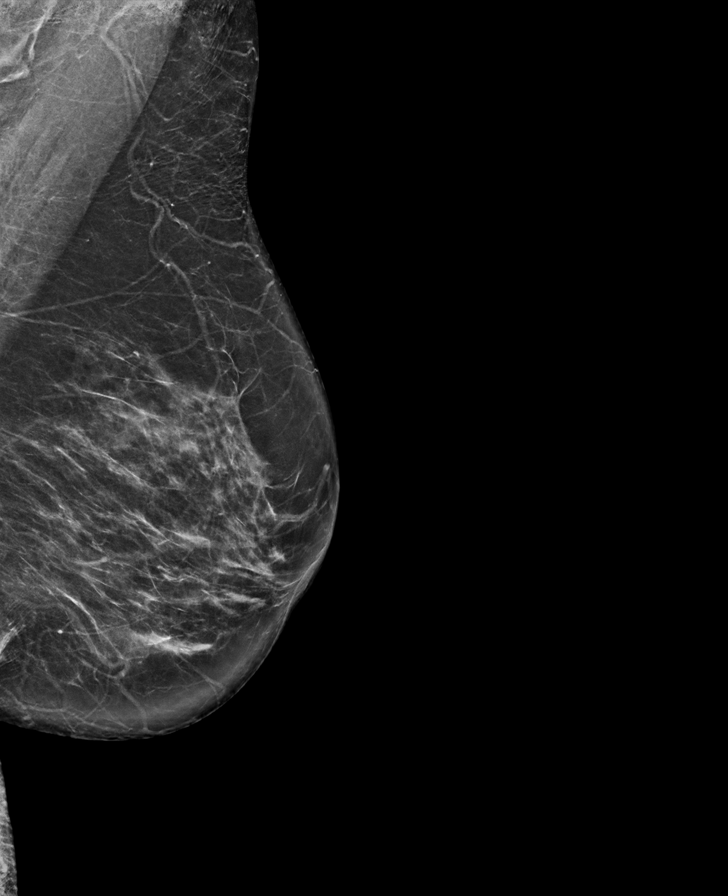

[L CC synth-2D]
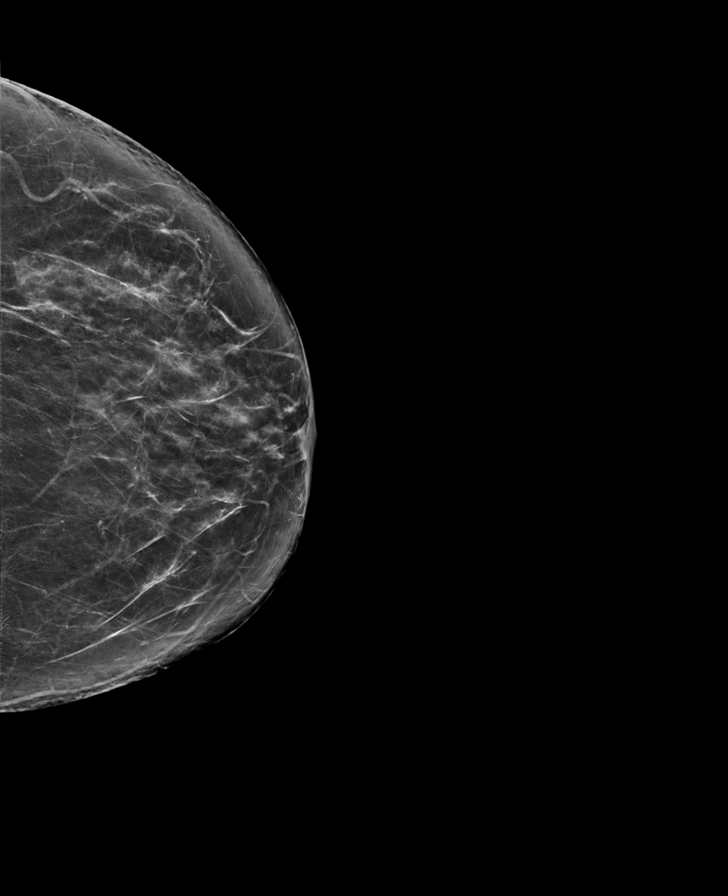

[R MLO synth-2D]
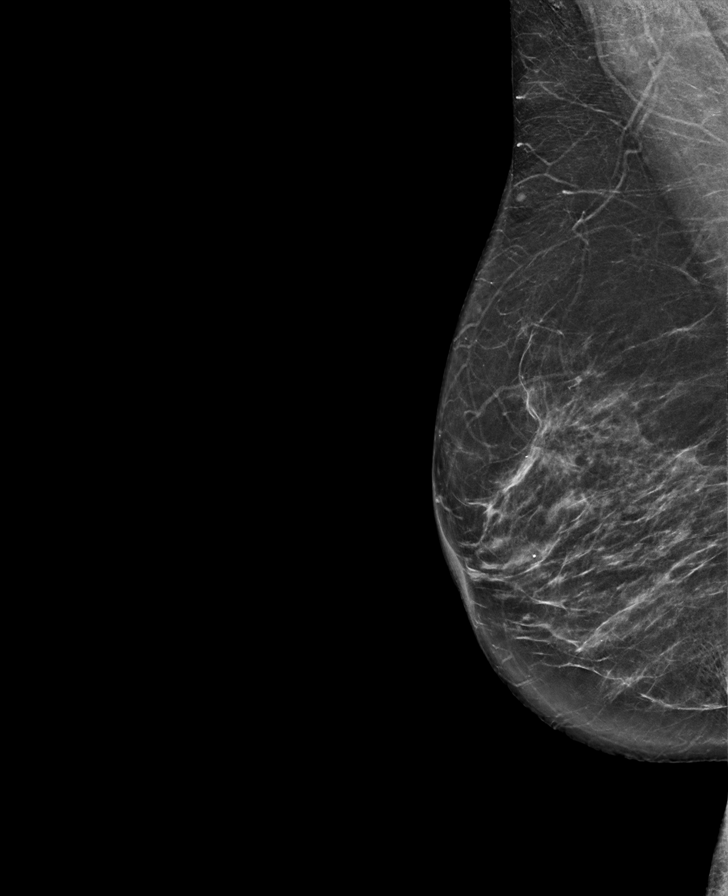

[R CC synth-2D]
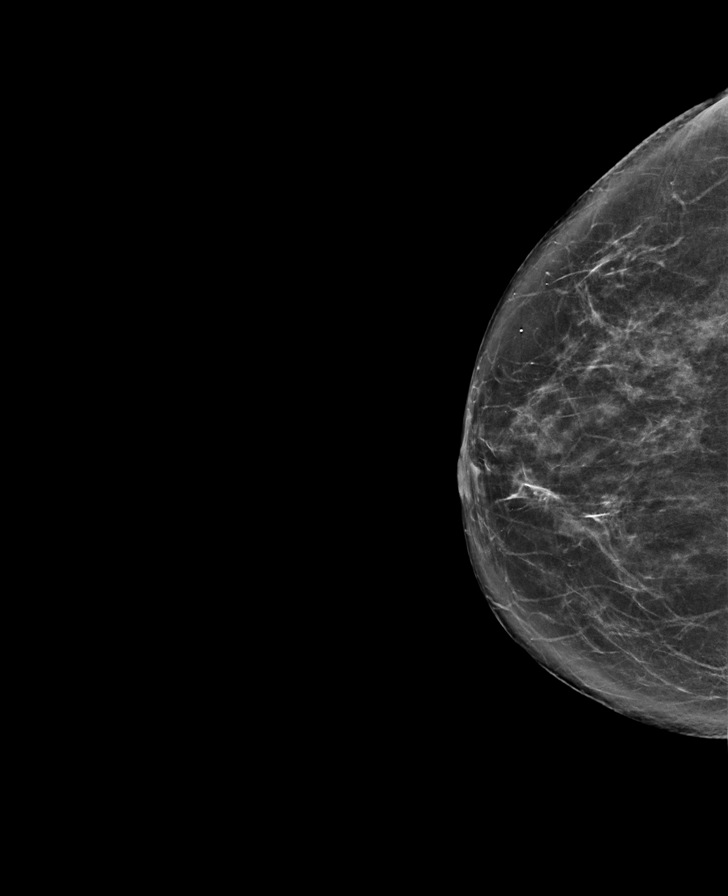

[L MLO tomo · tomo slice 39/78.0]
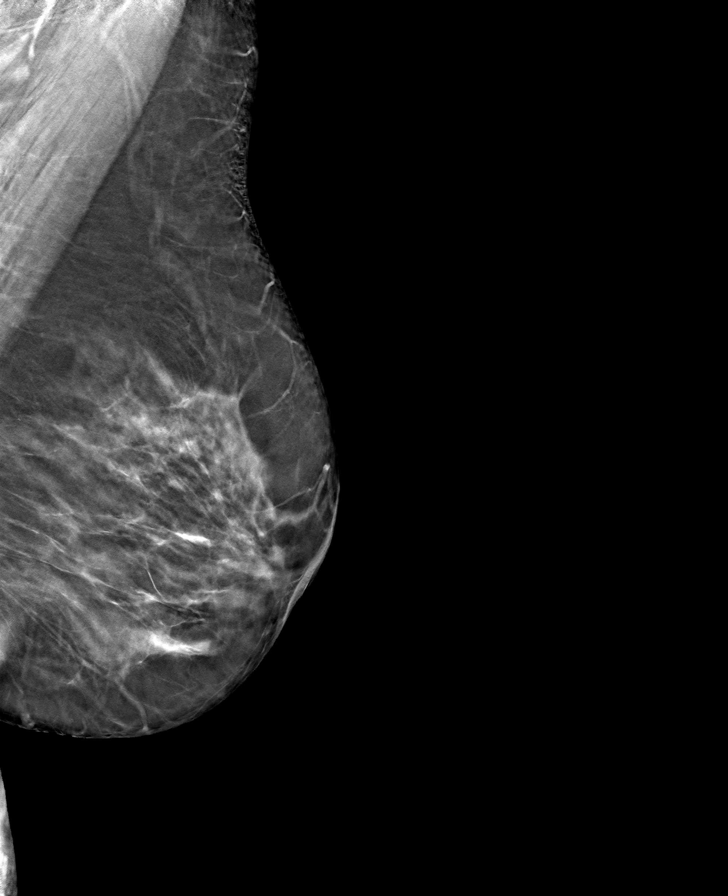

[R MLO tomo · tomo slice 39/77.0]
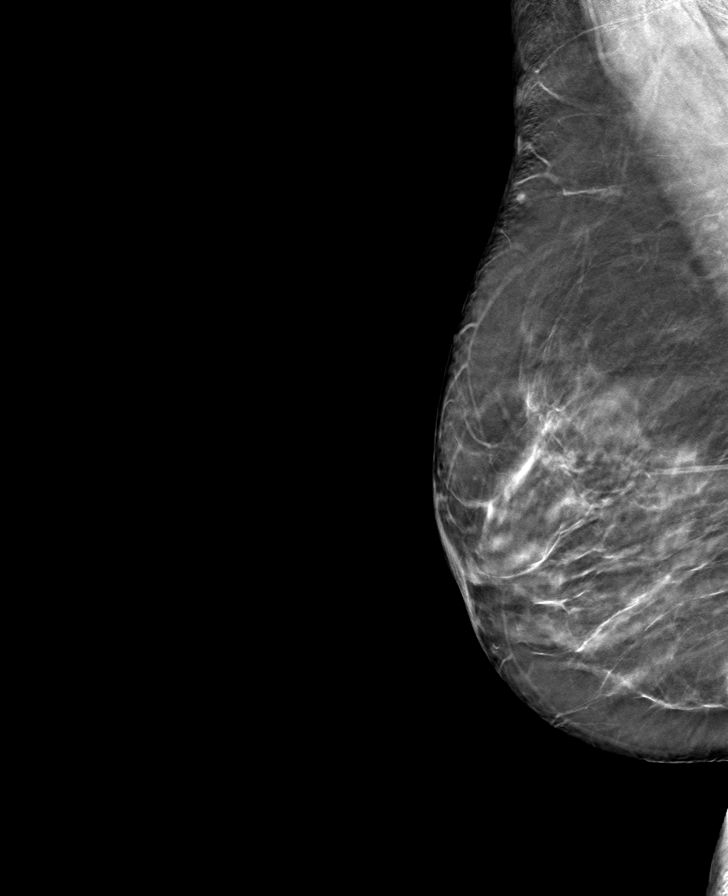

[R CC tomo · tomo slice 38/75.0]
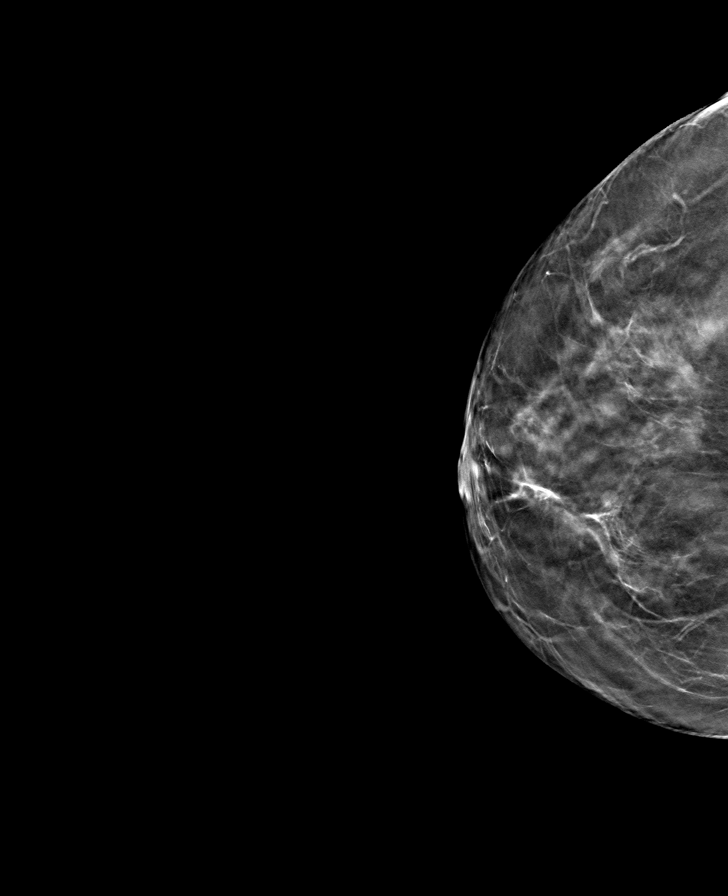

[L CC tomo · tomo slice 38/75.0]
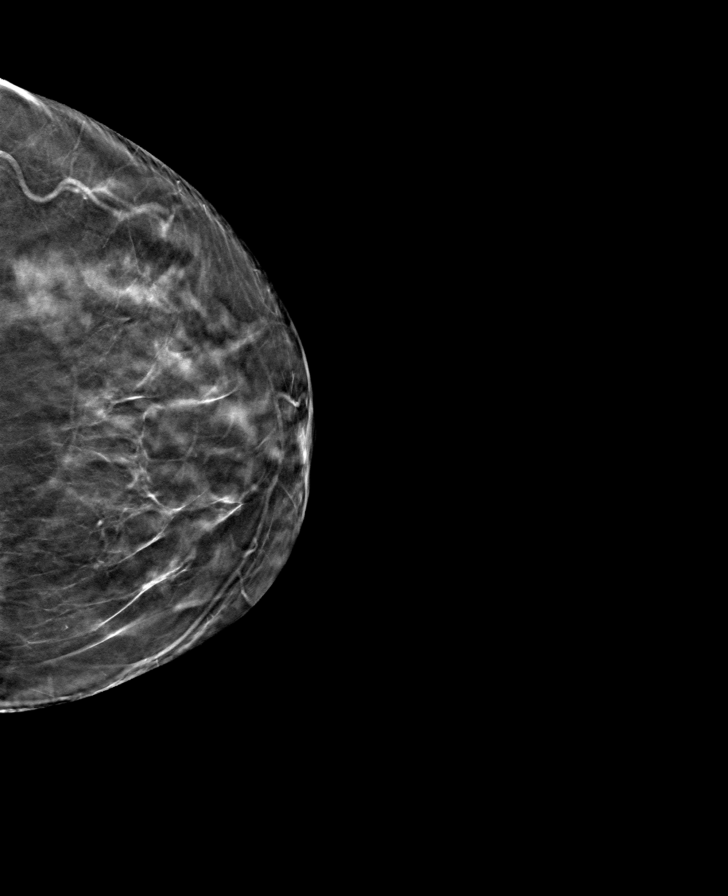

[8 of 24 positions shown; findings below may reference images not displayed]

ACR Breast Density Category b: There are scattered areas of
fibroglandular density.
FINDINGS: There are no findings suspicious for malignancy. The images were
evaluated with computer-aided detection.
IMPRESSION: No mammographic evidence of malignancy. A result letter of this
screening mammogram will be mailed directly to the patient.

RECOMMENDATION:
Screening mammogram in one year. (Code:[id number])

BI-RADS CATEGORY  1: Negative.

## 2023-02-14 ENCOUNTER — Encounter (INDEPENDENT_AMBULATORY_CARE_PROVIDER_SITE_OTHER): Payer: Self-pay

## 2023-02-21 ENCOUNTER — Telehealth: Payer: Self-pay | Admitting: Family Medicine

## 2023-02-21 MED ORDER — AMOXICILLIN 500 MG PO CAPS: ORAL_CAPSULE | ORAL | 0 refills | Status: DC

## 2023-02-21 NOTE — Telephone Encounter (Signed)
Sent Please send for last note from Dr Darleen Crocker  Thanks

## 2023-02-21 NOTE — Telephone Encounter (Signed)
Patient is scheduled for a dental cleaning on  August 30. Dr Darleen Crocker her orthopedic doctor suggest she be on an antibiotic before having this done. She would like to know if Dr Milinda Antis could send in for her?   [id number] [place], [place] - [address] Phone: [phone]  Fax: [phone]

## 2023-02-22 NOTE — Telephone Encounter (Signed)
E fax has been sent to Dr. Joice Lofts at [phone] requesting most recent office visit notes

## 2023-03-07 ENCOUNTER — Ambulatory Visit (INDEPENDENT_AMBULATORY_CARE_PROVIDER_SITE_OTHER): Payer: Medicare Other

## 2023-03-07 VITALS — Ht 67.0 in | Wt 165.0 lb

## 2023-03-07 DIAGNOSIS — Z1231 Encounter for screening mammogram for malignant neoplasm of breast: Secondary | ICD-10-CM | POA: Diagnosis not present

## 2023-03-07 DIAGNOSIS — Z0000 Encounter for general adult medical examination without abnormal findings: Secondary | ICD-10-CM | POA: Diagnosis not present

## 2023-03-07 NOTE — Patient Instructions (Addendum)
Ms. Czap , Thank you for taking time to come for your Medicare Wellness Visit. I appreciate your ongoing commitment to your health goals. Please review the following plan we discussed and let me know if I can assist you in the future.   Referrals/Orders/Follow-Ups/Clinician Recommendations: Aim for 30 minutes of exercise or brisk walking, 6-8 glasses of water, and 5 servings of fruits and vegetables each day.   You have an order for:  []   2D Mammogram  [x]   3D Mammogram  []   Bone Density     Please call for appointment:  [facility]  [address] [place] [address] [phone]  [facility] [address] [place], [place] [address] [phone]  [facility] [address] [place], [place] [address] [phone]    Make sure to wear two-piece clothing.  No lotions, powders, or deodorants the day of the appointment. Make sure to bring picture ID and insurance card.  Bring list of medications you are currently taking including any supplements.   Schedule your [place] screening mammogram through MyChart!   Log into your MyChart account.  Go to 'Visit' (or 'Appointments' if on mobile App) --> Schedule an Appointment  Under 'Select a Reason for Visit' choose the Mammogram Screening option.  Complete the pre-visit questions and select the time and place that best fits your schedule.    This is a list of the screening recommended for you and due dates:  Health Maintenance  Topic Date Due   COVID-19 Vaccine (5 - 2023-24 season) 03/17/2022   Medicare Annual Wellness Visit  02/01/2023   Flu Shot  02/15/2023   Hepatitis C Screening  09/20/2027*   Mammogram  04/06/2023   DTaP/Tdap/Td vaccine (4 - Td or Tdap) 07/27/2027   Colon Cancer Screening  08/18/2032   Pneumonia Vaccine  Completed   DEXA scan (bone density measurement)   Completed   Zoster (Shingles) Vaccine  Completed   HPV Vaccine  Aged Out  *Topic was postponed. The date shown is not the original due date.    Advanced directives: (Copy Requested) Please bring a copy of your health care power of attorney and living will to the office to be added to your chart at your convenience.  Next Medicare Annual Wellness Visit scheduled for next year: Yes

## 2023-03-07 NOTE — Progress Notes (Signed)
Subjective:   Caroline Francis is a 72 y.o. female who presents for Medicare Annual (Subsequent) preventive examination.  Visit Complete: Virtual  I connected with  Caroline Francis on 03/07/23 by a audio enabled telemedicine application and verified that I am speaking with the correct person using two identifiers.  Patient Location: Home  Provider Location: Office/Clinic  I discussed the limitations of evaluation and management by telemedicine. The patient expressed understanding and agreed to proceed.  Patient Medicare AWV questionnaire was completed by the patient on 03/07/23; I have confirmed that all information answered by patient is correct and no changes since this date.  Vital Signs: Because this visit was a virtual/telehealth visit, some criteria may be missing or patient reported. Any vitals not documented were not able to be obtained and vitals that have been documented are patient reported.    Review of Systems      Cardiac Risk Factors include: hypertension;dyslipidemia;advanced age (>47men, >92 women)     Objective:    Today's Vitals   03/07/23 1252  Weight: 165 lb (74.8 kg)  Height: 5\' 7"  (1.702 m)   Body mass index is 25.84 kg/m.     03/07/2023    1:01 PM 08/18/2022    9:58 AM 01/31/2022    8:53 AM 05/10/2021    8:45 AM 05/06/2021    8:38 AM 07/21/2020    8:57 AM  Advanced Directives  Does Patient Have a Medical Advance Directive? Yes No No Yes Yes Yes  Type of Estate agent of [place];Living will   Healthcare Power of [place];Living will Healthcare Power of [place];Living will Healthcare Power of [place];Living will  Does patient want to make changes to medical advance directive?     No - Patient declined   Copy of Healthcare Power of Attorney in Chart? No - copy requested    No - copy requested No - copy requested  Would patient like information on creating a medical advance directive?   No - Patient declined       Current  Medications (verified) Outpatient Encounter Medications as of 03/07/2023  Medication Sig   cholecalciferol (VITAMIN D3) 25 MCG (1000 UNIT) tablet Take 1,000 Units by mouth daily.   hydrochlorothiazide (HYDRODIURIL) 25 MG tablet TAKE 1 TABLET (25 MG TOTAL) BY MOUTH DAILY.   lansoprazole (PREVACID) 30 MG capsule TAKE 1 CAPSULE BY MOUTH 2 TIMES DAILY.   olmesartan (BENICAR) 40 MG tablet TAKE 1 TABLET BY MOUTH EVERY DAY   Omega-3 Fatty Acids (FISH OIL) 500 MG CAPS Take 500 mg by mouth daily.   TURMERIC CURCUMIN PO Take 1 capsule by mouth daily.   amoxicillin (AMOXIL) 500 MG capsule Take 4 pills (2 grams) 30 to 60 minutes prior to dental procedure, can take with food (Patient not taking: Reported on 03/07/2023)   vitamin B-12 (CYANOCOBALAMIN) 1000 MCG tablet Take 1,000 mcg by mouth daily.   No facility-administered encounter medications on file as of 03/07/2023.    Allergies (verified) Lisinopril   History: Past Medical History:  Diagnosis Date   GERD (gastroesophageal reflux disease)    Hyperlipidemia    Hypertension    Low TSH level    Past Surgical History:  Procedure Laterality Date   ABDOMINAL HYSTERECTOMY     partial /bleeding   COLONOSCOPY WITH PROPOFOL N/A 08/18/2022   Procedure: COLONOSCOPY WITH PROPOFOL;  Surgeon: [name], MD;  Location: [facility] ENDOSCOPY;  Service: Endoscopy;  Laterality: N/A;   ESOPHAGOGASTRODUODENOSCOPY N/A 08/18/2022   Procedure: ESOPHAGOGASTRODUODENOSCOPY (  EGD);  Surgeon: [name], MD;  Location: [facility] ENDOSCOPY;  Service: Endoscopy;  Laterality: N/A;   NASAL SEPTUM SURGERY     REVERSE SHOULDER ARTHROPLASTY Right 05/10/2021   Procedure: REVERSE SHOULDER ARTHROPLASTY;  Surgeon: [name], MD;  Location: [facility] ORS;  Service: Orthopedics;  Laterality: Right;   History reviewed. No pertinent family history. Social History   Socioeconomic History   Marital status: Divorced    Spouse name: Not on file   Number of children: Not on file    Years of education: Not on file   Highest education level: Not on file  Occupational History   Not on file  Tobacco Use   Smoking status: Never   Smokeless tobacco: Never  Vaping Use   Vaping status: Never Used  Substance and Sexual Activity   Alcohol use: Yes    Comment: occasional glass of wine   Drug use: No   Sexual activity: Not on file  Other Topics Concern   Not on file  Social History Narrative   Not on file   Social Determinants of Health   Financial Resource Strain: Low Risk  (03/07/2023)   Overall Financial Resource Strain (CARDIA)    Difficulty of Paying Living Expenses: Not hard at all  Food Insecurity: No Food Insecurity (03/07/2023)   Hunger Vital Sign    Worried About Running Out of Food in the Last Year: Never true    Ran Out of Food in the Last Year: Never true  Transportation Needs: No Transportation Needs (03/07/2023)   PRAPARE - Administrator, Civil Service (Medical): No    Lack of Transportation (Non-Medical): No  Physical Activity: Sufficiently Active (03/07/2023)   Exercise Vital Sign    Days of Exercise per Week: 7 days    Minutes of Exercise per Session: 30 min  Stress: No Stress Concern Present (03/07/2023)   [employer] of Occupational Health - Occupational Stress Questionnaire    Feeling of Stress : Not at all  Social Connections: Socially Isolated (03/07/2023)   Social Connection and Isolation Panel [NHANES]    Frequency of Communication with Friends and Family: More than three times a week    Frequency of Social Gatherings with Friends and Family: More than three times a week    Attends Religious Services: Never    Database administrator or Organizations: No    Attends Engineer, structural: Never    Marital Status: Divorced    Tobacco Counseling Counseling given: Not Answered   Clinical Intake:  Pre-visit preparation completed: Yes  Pain : No/denies pain     BMI - recorded: 25.84 Nutritional Status: BMI  25 -29 Overweight Nutritional Risks: None Diabetes: No  How often do you need to have someone help you when you read instructions, pamphlets, or other written materials from your doctor or pharmacy?: 1 - Never  Interpreter Needed?: No  Information entered by :: C.Lakeysha Slutsky LPN   Activities of Daily Living    03/07/2023    7:52 AM 02/01/2023    8:43 AM  In your present state of health, do you have any difficulty performing the following activities:  Hearing? 0 0  Vision? 0 0  Difficulty concentrating or making decisions? 0 0  Walking or climbing stairs? 0 0  Dressing or bathing? 0 0  Doing errands, shopping? 0 0  Preparing Food and eating ? N N  Using the Toilet? N N  In the past six months, have you accidently  leaked urine? N N  Do you have problems with loss of bowel control? N N  Managing your Medications? N N  Managing your Finances? N N  Housekeeping or managing your Housekeeping? N N    Patient Care Team: Tower, Audrie Gallus, MD as PCP - General  Indicate any recent Medical Services you may have received from other than [facility] providers in the past year (date may be approximate).     Assessment:   This is a routine wellness examination for [place].  Hearing/Vision screen Hearing Screening - Comments:: Denies hearing difficulties   Vision Screening - Comments:: Contacts - [facility] - Pt will call for appointment  Dietary issues and exercise activities discussed:     Goals Addressed             This Visit's Progress    Patient Stated       Lose 10-15 pounds       Depression Screen    03/07/2023   12:55 PM 01/31/2022    8:51 AM 07/21/2020    8:58 AM 03/03/2019    3:23 PM 09/19/2017    5:41 PM  PHQ 2/9 Scores  PHQ - 2 Score 0 0 0 0 1  PHQ- 9 Score  0 0      Fall Risk    03/07/2023    7:52 AM 02/01/2023    8:43 AM 01/31/2022    8:53 AM 07/21/2020    8:58 AM 06/10/2019    9:44 AM  Fall Risk   Falls in the past year? 0 0 0 0 0  Comment     Emmi Telephone  Survey: data to providers prior to load  Number falls in past yr: 0 0 0 0   Injury with Fall? 0 0 0 0   Risk for fall due to : No Fall Risks  No Fall Risks Medication side effect   Follow up Falls prevention discussed;Falls evaluation completed  Falls evaluation completed Falls evaluation completed;Falls prevention discussed     MEDICARE RISK AT HOME: Medicare Risk at Home Any stairs in or around the home?: Yes If so, are there any without handrails?: Yes Home free of loose throw rugs in walkways, pet beds, electrical cords, etc?: Yes Adequate lighting in your home to reduce risk of falls?: Yes Life alert?: No Use of a cane, walker or w/c?: No Grab bars in the bathroom?: No Shower chair or bench in shower?: Yes Elevated toilet seat or a handicapped toilet?: No  TIMED UP AND GO:  Was the test performed?  No    Cognitive Function:    07/21/2020    8:59 AM  MMSE - Mini Mental State Exam  Orientation to time 5  Orientation to Place 5  Registration 3  Attention/ Calculation 5  Recall 3  Language- repeat 1        03/07/2023    1:03 PM 01/31/2022    8:55 AM  6CIT Screen  What Year? 0 points 0 points  What month? 0 points 0 points  What time? 0 points 0 points  Count back from 20 0 points 0 points  Months in reverse 4 points 0 points  Repeat phrase 0 points 0 points  Total Score 4 points 0 points    Immunizations Immunization History  Administered Date(s) Administered   Fluad Quad(high Dose 65+) 05/09/2022   Influenza, High Dose Seasonal PF 05/06/2019   Influenza-Unspecified 08/03/2017, 06/16/2018, 04/27/2020   Moderna SARS-COV2 Booster Vaccination 10/07/2021, 10/07/2021  Moderna Sars-Covid-2 Vaccination 12/24/2020   PFIZER(Purple Top)SARS-COV-2 Vaccination 08/28/2019, 09/18/2019, 04/27/2020   PNEUMOCOCCAL CONJUGATE-20 10/07/2021, 05/21/2022   Pneumococcal Conjugate-13 09/19/2017   Pneumococcal Polysaccharide-23 03/03/2019   Respiratory Syncytial Virus  Vaccine,Recomb Aduvanted(Arexvy) 05/21/2022   Td 12/22/2003   Tdap 04/15/2014, 07/26/2017   Zoster Recombinant(Shingrix) 09/21/2017, 12/28/2017   Zoster, Live 05/01/2012    TDAP status: Up to date  Flu Vaccine status: Due, Education has been provided regarding the importance of this vaccine. Advised may receive this vaccine at local pharmacy or Health Dept. Aware to provide a copy of the vaccination record if obtained from local pharmacy or Health Dept. Verbalized acceptance and understanding.  Pneumococcal vaccine status: Up to date  Covid-19 vaccine status: Information provided on how to obtain vaccines.   Qualifies for Shingles Vaccine? Yes   Zostavax completed Yes   Shingrix Completed?: Yes  Screening Tests Health Maintenance  Topic Date Due   COVID-19 Vaccine (5 - 2023-24 season) 03/17/2022   INFLUENZA VACCINE  02/15/2023   Hepatitis C Screening  09/20/2027 (Originally 03/22/1969)   MAMMOGRAM  04/06/2023   Colonoscopy  08/19/2023   Medicare Annual Wellness (AWV)  03/06/2024   DTaP/Tdap/Td (4 - Td or Tdap) 07/27/2027   Pneumonia Vaccine 52+ Years old  Completed   DEXA SCAN  Completed   Zoster Vaccines- Shingrix  Completed   HPV VACCINES  Aged Out    Health Maintenance  Health Maintenance Due  Topic Date Due   COVID-19 Vaccine (5 - 2023-24 season) 03/17/2022   INFLUENZA VACCINE  02/15/2023    Colorectal cancer screening: Type of screening: Colonoscopy. Completed 08/18/22. Repeat every 1 years  Mammogram status: Ordered 03/07/23. Pt provided with contact info and advised to call to schedule appt.   Bone Density status: Completed 11/24/20. Results reflect: Bone density results: OSTEOPENIA. Repeat every 2 years. Pt declined  Lung Cancer Screening: (Low Dose CT Chest recommended if Age 5-80 years, 20 pack-year currently smoking OR have quit w/in 15years.) does not qualify.   Lung Cancer Screening Referral:    Additional Screening:  Hepatitis C Screening: does  qualify; Completed DUE  Vision Screening: Recommended annual ophthalmology exams for early detection of glaucoma and other disorders of the eye. Is the patient up to date with their annual eye exam?  Yes  Who is the provider or what is the name of the office in which the patient attends annual eye exams? [facility] If pt is not established with a provider, would they like to be referred to a provider to establish care? Yes .   Dental Screening: Recommended annual dental exams for proper oral hygiene    Community Resource Referral / Chronic Care Management: CRR required this visit?  No   CCM required this visit?  No     Plan:     I have personally reviewed and noted the following in the patient's chart:   Medical and social history Use of alcohol, tobacco or illicit drugs  Current medications and supplements including opioid prescriptions. Patient is not currently taking opioid prescriptions. Functional ability and status Nutritional status Physical activity Advanced directives List of other physicians Hospitalizations, surgeries, and ER visits in previous 12 months Vitals Screenings to include cognitive, depression, and falls Referrals and appointments  In addition, I have reviewed and discussed with patient certain preventive protocols, quality metrics, and best practice recommendations. A written personalized care plan for preventive services as well as general preventive health recommendations were provided to patient.     Cyril Woodmansee M  Abelardo Diesel, LPN   10/23/8117   After Visit Summary: (MyChart) Due to this being a telephonic visit, the after visit summary with patients personalized plan was offered to patient via MyChart   Nurse Notes: none

## 2023-03-15 ENCOUNTER — Other Ambulatory Visit: Payer: Self-pay | Admitting: Family Medicine

## 2023-03-21 DIAGNOSIS — Z23 Encounter for immunization: Secondary | ICD-10-CM | POA: Diagnosis not present

## 2023-04-09 ENCOUNTER — Ambulatory Visit
Admission: RE | Admit: 2023-04-09 | Discharge: 2023-04-09 | Disposition: A | Discharge status: Home / Self Care | Payer: Medicare Other | Source: Ambulatory Visit | Attending: Family Medicine | Admitting: Family Medicine

## 2023-04-09 DIAGNOSIS — Z1231 Encounter for screening mammogram for malignant neoplasm of breast: Secondary | ICD-10-CM | POA: Diagnosis present

## 2023-06-12 ENCOUNTER — Other Ambulatory Visit: Payer: Self-pay | Admitting: Family Medicine

## 2023-06-12 NOTE — Telephone Encounter (Signed)
Please call patient for appointment as requested.

## 2023-06-12 NOTE — Telephone Encounter (Signed)
Please refill both meds times one and schedule appointment (annual or follow up depending on pt preference)

## 2023-06-12 NOTE — Telephone Encounter (Signed)
Lvm for patient tcb and schedule 

## 2023-06-12 NOTE — Telephone Encounter (Signed)
Patient last seen in office 04/2022. Do not see where she has any appointments set up.

## 2023-06-15 ENCOUNTER — Other Ambulatory Visit: Payer: Self-pay | Admitting: Family Medicine

## 2023-06-18 NOTE — Telephone Encounter (Signed)
Please schedule annual (or follow up if she prefers) and refill until then  Thanks

## 2023-06-18 NOTE — Telephone Encounter (Signed)
Patient last seen 06/09/22 for cpe no follow up scheduled. Ok to fill?

## 2023-06-19 NOTE — Telephone Encounter (Signed)
LVM for patient to c/b and schedule.  

## 2023-06-19 NOTE — Telephone Encounter (Signed)
Patient has been scheduled

## 2023-06-29 ENCOUNTER — Ambulatory Visit: Payer: Medicare Other | Admitting: Family Medicine

## 2023-08-01 ENCOUNTER — Other Ambulatory Visit: Payer: Self-pay | Admitting: Family Medicine

## 2023-08-02 NOTE — Telephone Encounter (Signed)
To soon to refill any of these meds will discuss with pt at her upcoming appt

## 2023-08-07 ENCOUNTER — Ambulatory Visit: Payer: Medicare Other | Admitting: Family Medicine

## 2023-08-07 ENCOUNTER — Encounter: Payer: Self-pay | Admitting: Family Medicine

## 2023-08-07 VITALS — BP 118/64 | HR 67 | Temp 98.2°F | Ht 66.5 in | Wt 163.0 lb

## 2023-08-07 DIAGNOSIS — Z860100 Personal history of colon polyps, unspecified: Secondary | ICD-10-CM

## 2023-08-07 DIAGNOSIS — K219 Gastro-esophageal reflux disease without esophagitis: Secondary | ICD-10-CM

## 2023-08-07 DIAGNOSIS — E559 Vitamin D deficiency, unspecified: Secondary | ICD-10-CM

## 2023-08-07 DIAGNOSIS — E7800 Pure hypercholesterolemia, unspecified: Secondary | ICD-10-CM

## 2023-08-07 DIAGNOSIS — Z79899 Other long term (current) drug therapy: Secondary | ICD-10-CM

## 2023-08-07 DIAGNOSIS — I10 Essential (primary) hypertension: Secondary | ICD-10-CM

## 2023-08-07 DIAGNOSIS — E538 Deficiency of other specified B group vitamins: Secondary | ICD-10-CM | POA: Diagnosis not present

## 2023-08-07 DIAGNOSIS — E0590 Thyrotoxicosis, unspecified without thyrotoxic crisis or storm: Secondary | ICD-10-CM | POA: Diagnosis not present

## 2023-08-07 DIAGNOSIS — L84 Corns and callosities: Secondary | ICD-10-CM

## 2023-08-07 DIAGNOSIS — R7309 Other abnormal glucose: Secondary | ICD-10-CM

## 2023-08-07 DIAGNOSIS — M8580 Other specified disorders of bone density and structure, unspecified site: Secondary | ICD-10-CM

## 2023-08-07 MED ORDER — OLMESARTAN MEDOXOMIL 40 MG PO TABS: 40.0000 mg | ORAL_TABLET | Freq: Every day | ORAL | 3 refills | Status: AC

## 2023-08-07 MED ORDER — HYDROCHLOROTHIAZIDE 25 MG PO TABS: 25.0000 mg | ORAL_TABLET | Freq: Every day | ORAL | 3 refills | Status: AC

## 2023-08-07 MED ORDER — LANSOPRAZOLE 30 MG PO CPDR: 30.0000 mg | DELAYED_RELEASE_CAPSULE | Freq: Every day | ORAL | 3 refills | Status: DC | PRN

## 2023-08-07 NOTE — Progress Notes (Addendum)
Subjective:    Patient ID: [name], female    DOB: 09-Aug-1950, 73 y.o.   MRN: [id number]  HPI  Wt Readings from Last 3 Encounters:  08/07/23 163 lb (73.9 kg)  03/07/23 165 lb (74.8 kg)  08/18/22 165 lb 9.6 oz (75.1 kg)   25.91 kg/m  Vitals:   08/07/23 1559  BP: 118/64  Pulse: 67  Temp: 98.2 F (36.8 C)  SpO2: 98%    Pt presents for medicine refill visit /chronic health problems  Feeling good  Taking care of herself   Silver sneakers 2 times per week  Water aerobics 3 times per week   HTN bp is stable today  No cp or palpitations or headaches or edema  No side effects to medicines  BP Readings from Last 3 Encounters:  08/07/23 118/64  08/18/22 (!) 163/103  05/09/22 116/78    Pulse Readings from Last 3 Encounters:  08/07/23 67  08/18/22 68  05/09/22 75   Hydrochlorothiazide 25 mg daily  Benicar 40 mg daily   Lab Results  Component Value Date   NA 138 08/07/2023   K 4.1 08/07/2023   CO2 30 08/07/2023   GLUCOSE 107 (H) 08/07/2023   BUN 16 08/07/2023   CREATININE 0.78 08/07/2023   CALCIUM 9.4 08/07/2023   GFR 75.90 08/07/2023   GFRNONAA >60 05/06/2021   Due for labs    History of hyperthyroid  RI treatment in past  Due for TSH  GERD Takes prevacid 30 mg daily - as needed  Does not need it every single day  Tries to eat early and avoid triggers   Hyperlipidemia Lab Results  Component Value Date   CHOL 253 (H) 08/07/2023   HDL 58.40 08/07/2023   LDLCALC 130 (H) 03/30/2022   LDLDIRECT 142.0 08/07/2023   TRIG (H) 08/07/2023    445.0 Triglyceride is over 400; calculations on Lipids are invalid.   CHOLHDL 4 08/07/2023   Diet controlled  Eats healthy in general  Not much red meat  Not much fried foods /occasionally   B12 def Takes 1000 mcg daily   D 1000 international units daily   Osteopenia 11/2020 dexa  [place]  Declines another quite yet     Patient Active Problem List   Diagnosis Date Noted   History of colon  polyps 08/07/2023   Corn of foot 10/27/2021   Lumbar radicular pain 10/27/2021   Status post reverse total shoulder replacement, right 05/11/2021   Rotator cuff arthropathy of right shoulder 03/25/2021   Vitamin B12 deficiency 07/29/2020   Vitamin D deficiency 07/29/2020   Current use of proton pump inhibitor 07/28/2020   Screening mammogram, encounter for 03/12/2019   Osteopenia 11/04/2017   Estrogen deficiency 09/19/2017   Elevated glucose level 09/19/2017   Osteoarthritis of hip 07/26/2015   External hemorrhoids 04/09/2012   Routine general medical examination at a health care facility 04/02/2012   SNORING, HX OF 05/04/2010   Hyperthyroidism 03/31/2010   Hyperlipidemia 12/25/2007   Essential hypertension 12/25/2007   GERD 12/25/2007   Diaphragmatic hernia 12/25/2007   Past Medical History:  Diagnosis Date   GERD (gastroesophageal reflux disease)    Hyperlipidemia    Hypertension    Low TSH level    Past Surgical History:  Procedure Laterality Date   ABDOMINAL HYSTERECTOMY     partial /bleeding   COLONOSCOPY WITH PROPOFOL N/A 08/18/2022   Procedure: COLONOSCOPY WITH PROPOFOL;  Surgeon: [name], MD;  Location: [facility] ENDOSCOPY;  Service: Endoscopy;  Laterality: N/A;   ESOPHAGOGASTRODUODENOSCOPY N/A 08/18/2022   Procedure: ESOPHAGOGASTRODUODENOSCOPY (EGD);  Surgeon: [name], MD;  Location: [facility] ENDOSCOPY;  Service: Endoscopy;  Laterality: N/A;   NASAL SEPTUM SURGERY     REVERSE SHOULDER ARTHROPLASTY Right 05/10/2021   Procedure: REVERSE SHOULDER ARTHROPLASTY;  Surgeon: [name], MD;  Location: [facility] ORS;  Service: Orthopedics;  Laterality: Right;   Social History   Tobacco Use   Smoking status: Never   Smokeless tobacco: Never  Vaping Use   Vaping status: Never Used  Substance Use Topics   Alcohol use: Yes    Comment: occasional glass of wine   Drug use: No   Family History  Problem Relation Age of Onset   Breast cancer Neg Hx     Allergies  Allergen Reactions   Lisinopril Cough   Current Outpatient Medications on File Prior to Visit  Medication Sig Dispense Refill   cholecalciferol (VITAMIN D3) 25 MCG (1000 UNIT) tablet Take 1,000 Units by mouth daily as needed.     Omega-3 Fatty Acids (FISH OIL) 500 MG CAPS Take 500 mg by mouth daily as needed.     TURMERIC CURCUMIN PO Take 1 capsule by mouth daily as needed.     vitamin B-12 (CYANOCOBALAMIN) 1000 MCG tablet Take 1,000 mcg by mouth daily as needed.     No current facility-administered medications on file prior to visit.    Review of Systems  Constitutional:  Negative for activity change, appetite change, fatigue, fever and unexpected weight change.  HENT:  Negative for congestion, ear pain, rhinorrhea, sinus pressure and sore throat.   Eyes:  Negative for pain, redness and visual disturbance.  Respiratory:  Negative for cough, shortness of breath and wheezing.   Cardiovascular:  Negative for chest pain and palpitations.  Gastrointestinal:  Negative for abdominal pain, blood in stool, constipation and diarrhea.  Endocrine: Negative for polydipsia and polyuria.  Genitourinary:  Negative for dysuria, frequency and urgency.  Musculoskeletal:  Positive for arthralgias. Negative for back pain and myalgias.  Skin:  Negative for pallor and rash.  Allergic/Immunologic: Negative for environmental allergies.  Neurological:  Negative for dizziness, syncope and headaches.  Hematological:  Negative for adenopathy. Does not bruise/bleed easily.  Psychiatric/Behavioral:  Negative for decreased concentration and dysphoric mood. The patient is not nervous/anxious.        Objective:   Physical Exam Constitutional:      General: She is not in acute distress.    Appearance: Normal appearance. She is well-developed and normal weight. She is not ill-appearing or diaphoretic.  HENT:     Head: Normocephalic and atraumatic.     Right Ear: Tympanic membrane, ear canal and  external ear normal.     Left Ear: Tympanic membrane, ear canal and external ear normal.     Nose: Nose normal. No congestion.     Mouth/Throat:     Mouth: Mucous membranes are moist.     Pharynx: Oropharynx is clear. No posterior oropharyngeal erythema.  Eyes:     General: No scleral icterus.    Extraocular Movements: Extraocular movements intact.     Conjunctiva/sclera: Conjunctivae normal.     Pupils: Pupils are equal, round, and reactive to light.  Neck:     Thyroid: No thyromegaly.     Vascular: No carotid bruit or JVD.  Cardiovascular:     Rate and Rhythm: Normal rate and regular rhythm.     Pulses: Normal pulses.     Heart sounds: Normal heart sounds.  No gallop.  Pulmonary:     Effort: Pulmonary effort is normal. No respiratory distress.     Breath sounds: Normal breath sounds. No wheezing.     Comments: Good air exch Chest:     Chest wall: No tenderness.  Abdominal:     General: Bowel sounds are normal. There is no distension or abdominal bruit.     Palpations: Abdomen is soft. There is no mass.     Tenderness: There is no abdominal tenderness.     Hernia: No hernia is present.  Musculoskeletal:        General: No tenderness. Normal range of motion.     Cervical back: Normal range of motion and neck supple. No rigidity. No muscular tenderness.     Right lower leg: No edema.     Left lower leg: No edema.     Comments: No kyphosis   Lymphadenopathy:     Cervical: No cervical adenopathy.  Skin:    General: Skin is warm and dry.     Coloration: Skin is not pale.     Findings: No erythema or rash.     Comments: Solar lentigines diffusely   Neurological:     Mental Status: She is alert. Mental status is at baseline.     Cranial Nerves: No cranial nerve deficit.     Motor: No abnormal muscle tone.     Coordination: Coordination normal.     Gait: Gait normal.     Deep Tendon Reflexes: Reflexes are normal and symmetric.  Psychiatric:        Mood and Affect: Mood  normal.        Cognition and Memory: Cognition and memory normal.           Assessment & Plan:   Problem List Items Addressed This Visit       Cardiovascular and Mediastinum   Essential hypertension - Primary   bp in fair control at this time  BP Readings from Last 1 Encounters:  08/07/23 118/64   No changes needed Most recent labs reviewed  Disc lifstyle change with low sodium diet and exercise  Hydrochlorothiazide 25 mg daily  Benicar 40 mg daily   Lab today       Relevant Medications   hydrochlorothiazide (HYDRODIURIL) 25 MG tablet   olmesartan (BENICAR) 40 MG tablet   Other Relevant Orders   TSH (Completed)   Lipid Panel (Completed)   Comprehensive metabolic panel (Completed)   CBC with Differential/Platelet (Completed)     Digestive   GERD   Takes prevacid 30 mg daily prn Watches out for triggering foods and does not eat too late       Relevant Medications   lansoprazole (PREVACID) 30 MG capsule     Endocrine   Hyperthyroidism   History of hyperthyroid with past RI treatment with Dr Tedd Sias   No clinical changes TSH today      Relevant Orders   TSH (Completed)     Musculoskeletal and Integument   Osteopenia   Last dexa 11/2020  Declines another this year but may consider later No falls or fractures  Taking vit D   Discussed fall prevention, supplements and exercise for bone density        Corn of foot   Medial right foot - chronic  Referred to podiatry        Relevant Orders   Ambulatory referral to Podiatry     Other   Vitamin D deficiency   D level  today  Taking 1000 international units daily for bone and overall health History of osteopenia       Relevant Orders   VITAMIN D 25 Hydroxy (Vit-D Deficiency, Fractures) (Completed)   Vitamin B12 deficiency   B12 level today  On ppi prn  Takes B12 1000 mcg daily        Relevant Orders   Vitamin B12 (Completed)   Hyperlipidemia   Disc goals for lipids and reasons to control  them Rev last labs with pt Rev low sat fat diet in detail    Lab today  Has been diet controlled Some holiday eating  Avoids red meat    Addendum: Lab Results  Component Value Date   CHOL 253 (H) 08/07/2023   HDL 58.40 08/07/2023   LDLCALC 130 (H) 03/30/2022   LDLDIRECT 142.0 08/07/2023   TRIG (H) 08/07/2023    445.0 Triglyceride is over 400; calculations on Lipids are invalid.   CHOLHDL 4 08/07/2023   Pt declines medicine  Will work on diet and re check 3 mo      Relevant Medications   hydrochlorothiazide (HYDRODIURIL) 25 MG tablet   olmesartan (BENICAR) 40 MG tablet   Other Relevant Orders   Lipid Panel (Completed)   Comprehensive metabolic panel (Completed)   LDL cholesterol, direct (Completed)   Lipid panel   History of colon polyps   Pt is due for one year recall colonoscopy 08/2023 -pt is aware Plans to call and schedule    If she needs a referral will let us know       Elevated glucose level   A1c ordered disc imp of low glycemic diet and wt loss to prevent DM2       Relevant Orders   Hemoglobin A1c (Completed)   Current use of proton pump inhibitor   B12 and D levels with labs today   Takes prevacid if needed for gerd       Relevant Orders   Vitamin B12 (Completed)

## 2023-08-07 NOTE — Assessment & Plan Note (Signed)
Last dexa 11/2020  Declines another this year but may consider later No falls or fractures  Taking vit D   Discussed fall prevention, supplements and exercise for bone density

## 2023-08-07 NOTE — Assessment & Plan Note (Addendum)
Disc goals for lipids and reasons to control them Rev last labs with pt Rev low sat fat diet in detail    Lab today  Has been diet controlled Some holiday eating  Avoids red meat    Addendum: Lab Results  Component Value Date   CHOL 253 (H) 08/07/2023   HDL 58.40 08/07/2023   LDLCALC 130 (H) 03/30/2022   LDLDIRECT 142.0 08/07/2023   TRIG (H) 08/07/2023    445.0 Triglyceride is over 400; calculations on Lipids are invalid.   CHOLHDL 4 08/07/2023   Pt declines medicine  Will work on diet and re check 3 mo

## 2023-08-07 NOTE — Assessment & Plan Note (Signed)
Medial right foot - chronic  Referred to podiatry

## 2023-08-07 NOTE — Patient Instructions (Addendum)
I put the referral in for podiatry  Please let us know if you don't hear in 1-2 weeks   You are due for colonoscopy next month I think  Give them a call if you don't hear soon from them   Labs today   Keep up the great exercise

## 2023-08-07 NOTE — Assessment & Plan Note (Signed)
History of hyperthyroid with past RI treatment with Dr Tedd Sias   No clinical changes TSH today

## 2023-08-07 NOTE — Assessment & Plan Note (Signed)
bp in fair control at this time  BP Readings from Last 1 Encounters:  08/07/23 118/64   No changes needed Most recent labs reviewed  Disc lifstyle change with low sodium diet and exercise  Hydrochlorothiazide 25 mg daily  Benicar 40 mg daily   Lab today

## 2023-08-07 NOTE — Assessment & Plan Note (Signed)
B12 level today  On ppi prn  Takes B12 1000 mcg daily

## 2023-08-07 NOTE — Assessment & Plan Note (Signed)
Takes prevacid 30 mg daily prn Watches out for triggering foods and does not eat too late

## 2023-08-07 NOTE — Assessment & Plan Note (Signed)
A1c ordered disc imp of low glycemic diet and wt loss to prevent DM2  

## 2023-08-07 NOTE — Assessment & Plan Note (Signed)
B12 and D levels with labs today   Takes prevacid if needed for gerd

## 2023-08-07 NOTE — Assessment & Plan Note (Signed)
Pt is due for one year recall colonoscopy 08/2023 -pt is aware Plans to call and schedule    If she needs a referral will let us know

## 2023-08-07 NOTE — Assessment & Plan Note (Signed)
D level today  Taking 1000 international units daily for bone and overall health History of osteopenia

## 2023-08-08 ENCOUNTER — Encounter: Payer: Self-pay | Admitting: Family Medicine

## 2023-08-08 LAB — COMPREHENSIVE METABOLIC PANEL
ALT: 13 U/L (ref 0–35)
AST: 14 U/L (ref 0–37)
Albumin: 4.3 g/dL (ref 3.5–5.2)
Alkaline Phosphatase: 66 U/L (ref 39–117)
BUN: 16 mg/dL (ref 6–23)
CO2: 30 meq/L (ref 19–32)
Calcium: 9.4 mg/dL (ref 8.4–10.5)
Chloride: 100 meq/L (ref 96–112)
Creatinine, Ser: 0.78 mg/dL (ref 0.40–1.20)
GFR: 75.9 mL/min (ref >60.00)
Glucose, Bld: 107 mg/dL — ABNORMAL HIGH (ref 70–99)
Potassium: 4.1 meq/L (ref 3.5–5.1)
Sodium: 138 meq/L (ref 135–145)
Total Bilirubin: 0.3 mg/dL (ref 0.2–1.2)
Total Protein: 6.2 g/dL (ref 6.0–8.3)

## 2023-08-08 LAB — CBC WITH DIFFERENTIAL/PLATELET
Basophils Absolute: 0.1 10*3/uL (ref 0.0–0.1)
Basophils Relative: 0.8 % (ref 0.0–3.0)
Eosinophils Absolute: 0.2 10*3/uL (ref 0.0–0.7)
Eosinophils Relative: 2.5 % (ref 0.0–5.0)
HCT: 42.3 % (ref 36.0–46.0)
Hemoglobin: 14.4 g/dL (ref 12.0–15.0)
Lymphocytes Relative: 26.1 % (ref 12.0–46.0)
Lymphs Abs: 1.9 10*3/uL (ref 0.7–4.0)
MCHC: 34.2 g/dL (ref 30.0–36.0)
MCV: 97.3 fL (ref 78.0–100.0)
Monocytes Absolute: 0.4 10*3/uL (ref 0.1–1.0)
Monocytes Relative: 5.8 % (ref 3.0–12.0)
Neutro Abs: 4.8 10*3/uL (ref 1.4–7.7)
Neutrophils Relative %: 64.8 % (ref 43.0–77.0)
Platelets: 271 10*3/uL (ref 150.0–400.0)
RBC: 4.34 Mil/uL (ref 3.87–5.11)
RDW: 12.1 % (ref 11.5–15.5)
WBC: 7.3 10*3/uL (ref 4.0–10.5)

## 2023-08-08 LAB — VITAMIN D 25 HYDROXY (VIT D DEFICIENCY, FRACTURES): VITD: 28.72 ng/mL — ABNORMAL LOW (ref 30.00–100.00)

## 2023-08-08 LAB — LIPID PANEL
Cholesterol: 253 mg/dL — ABNORMAL HIGH (ref 0–200)
HDL: 58.4 mg/dL (ref >39.00)
NonHDL: 194.86
Total CHOL/HDL Ratio: 4
Triglycerides: 445 mg/dL — ABNORMAL HIGH (ref 0.0–149.0)
VLDL: 89 mg/dL — ABNORMAL HIGH (ref 0.0–40.0)

## 2023-08-08 LAB — VITAMIN B12: Vitamin B-12: 239 pg/mL (ref 211–911)

## 2023-08-08 LAB — TSH: TSH: 1.73 u[IU]/mL (ref 0.35–5.50)

## 2023-08-08 LAB — HEMOGLOBIN A1C: Hgb A1c MFr Bld: 5.5 % (ref 4.6–6.5)

## 2023-08-08 LAB — LDL CHOLESTEROL, DIRECT: Direct LDL: 142 mg/dL

## 2023-08-13 NOTE — Addendum Note (Signed)
Addended by: Roxy Manns A on: 08/13/2023 10:49 AM   Modules accepted: Orders

## 2023-08-17 ENCOUNTER — Ambulatory Visit
Admission: RE | Admit: 2023-08-17 | Discharge: 2023-08-17 | Disposition: A | Discharge status: Home / Self Care | Payer: Medicare Other | Source: Ambulatory Visit | Attending: Nurse Practitioner | Admitting: Nurse Practitioner

## 2023-08-17 ENCOUNTER — Ambulatory Visit: Payer: Self-pay | Admitting: Family Medicine

## 2023-08-17 ENCOUNTER — Ambulatory Visit (INDEPENDENT_AMBULATORY_CARE_PROVIDER_SITE_OTHER): Payer: Medicare Other | Admitting: Nurse Practitioner

## 2023-08-17 VITALS — BP 128/88 | HR 69 | Temp 97.5°F | Ht 66.5 in | Wt 160.4 lb

## 2023-08-17 DIAGNOSIS — R1032 Left lower quadrant pain: Secondary | ICD-10-CM | POA: Insufficient documentation

## 2023-08-17 DIAGNOSIS — R8290 Unspecified abnormal findings in urine: Secondary | ICD-10-CM | POA: Diagnosis not present

## 2023-08-17 LAB — COMPREHENSIVE METABOLIC PANEL
ALT: 16 U/L (ref 0–35)
AST: 16 U/L (ref 0–37)
Albumin: 4.4 g/dL (ref 3.5–5.2)
Alkaline Phosphatase: 67 U/L (ref 39–117)
BUN: 13 mg/dL (ref 6–23)
CO2: 29 meq/L (ref 19–32)
Calcium: 9.5 mg/dL (ref 8.4–10.5)
Chloride: 99 meq/L (ref 96–112)
Creatinine, Ser: 0.8 mg/dL (ref 0.40–1.20)
GFR: 73.62 mL/min (ref >60.00)
Glucose, Bld: 111 mg/dL — ABNORMAL HIGH (ref 70–99)
Potassium: 4.1 meq/L (ref 3.5–5.1)
Sodium: 139 meq/L (ref 135–145)
Total Bilirubin: 0.7 mg/dL (ref 0.2–1.2)
Total Protein: 6.7 g/dL (ref 6.0–8.3)

## 2023-08-17 LAB — POCT URINALYSIS DIPSTICK
Bilirubin, UA: POSITIVE
Blood, UA: NEGATIVE
Glucose, UA: NEGATIVE
Ketones, UA: POSITIVE
Nitrite, UA: NEGATIVE
Protein, UA: POSITIVE — AB
Spec Grav, UA: 1.025 (ref 1.010–1.025)
Urobilinogen, UA: 0.2 U/dL
pH, UA: 5.5 (ref 5.0–8.0)

## 2023-08-17 LAB — CBC
HCT: 44 % (ref 36.0–46.0)
Hemoglobin: 14.8 g/dL (ref 12.0–15.0)
MCHC: 33.6 g/dL (ref 30.0–36.0)
MCV: 97.2 fL (ref 78.0–100.0)
Platelets: 291 10*3/uL (ref 150.0–400.0)
RBC: 4.53 Mil/uL (ref 3.87–5.11)
RDW: 12 % (ref 11.5–15.5)
WBC: 7.5 10*3/uL (ref 4.0–10.5)

## 2023-08-17 LAB — LIPASE: Lipase: 10 U/L — ABNORMAL LOW (ref 11.0–59.0)

## 2023-08-17 MED ORDER — IOHEXOL 300 MG/ML  SOLN
100.0000 mL | Freq: Once | INTRAMUSCULAR | Status: AC | PRN
  Administered 2023-08-17: 100 mL via INTRAVENOUS

## 2023-08-17 NOTE — Progress Notes (Signed)
Acute Office Visit  Subjective:     Patient ID: Caroline Francis, female    DOB: 1951-03-02, 73 y.o.   MRN: [id number]  Chief Complaint  Patient presents with   Abdominal Pain    Pt complains of lower left abdominal pain and diarrhea. Ongoing for a couple months. Pt states pain is increasing. Pain level 7-8 today. Slight lower back pain.    Diarrhea     Patient is in today for abdominal pain  with a history of HTN, hemorrhoids, GERD, hyperthyroidism. Multiple colon polyps.  Last colonoscopy was done on 08/18/2022 by Dr. Eather Colas  Patient was seen in the GI clinic on 08/15/2023  States that the left lower abdomen for the past few months that is intermittent. States that she had a flare up this morning. States the pain is all the time and a dull aching pain.  She has not noticed anything that makes it better or makes it worse.  Is not affected with movement or fluid or food intake.  Patient also denies any urinary symptoms.  No history of nephrolithiasis.  Patient denies any vaginal discharge or bleeding.  Patient has a history of a partial hysterectomy.  She has not tried anything over-the-counter for pain thus far.  Review of Systems  Constitutional:  Negative for chills and fever.  Gastrointestinal:  Positive for abdominal pain, blood in stool and diarrhea. Negative for nausea and vomiting.  Genitourinary:  Negative for dysuria, frequency and hematuria.        Objective:    BP 128/88   Pulse 69   Temp (!) 97.5 F (36.4 C) (Oral)   Ht 5' 6.5" (1.689 m)   Wt 160 lb 6.4 oz (72.8 kg)   SpO2 99%   BMI 25.50 kg/m  BP Readings from Last 3 Encounters:  08/17/23 128/88  08/07/23 118/64  08/18/22 (!) 163/103   Wt Readings from Last 3 Encounters:  08/17/23 160 lb 6.4 oz (72.8 kg)  08/07/23 163 lb (73.9 kg)  03/07/23 165 lb (74.8 kg)   SpO2 Readings from Last 3 Encounters:  08/17/23 99%  08/07/23 98%  08/18/22 98%      Physical Exam Vitals and nursing note  reviewed.  Constitutional:      Appearance: Normal appearance.  Cardiovascular:     Rate and Rhythm: Normal rate and regular rhythm.     Heart sounds: Normal heart sounds.  Pulmonary:     Effort: Pulmonary effort is normal.     Breath sounds: Normal breath sounds.  Abdominal:     General: Bowel sounds are normal. There is no distension.     Palpations: There is no mass.     Tenderness: There is abdominal tenderness. There is no right CVA tenderness or left CVA tenderness.     Hernia: No hernia is present.  Musculoskeletal:     Lumbar back: No bony tenderness. Negative left straight leg raise test.     Left hip: No tenderness or bony tenderness. Normal strength.  Neurological:     Mental Status: She is alert.     Results for orders placed or performed in visit on 08/17/23  POCT urinalysis dipstick  Result Value Ref Range   Color, UA dark yellow    Clarity, UA cloudy    Glucose, UA Negative Negative   Bilirubin, UA pos    Ketones, UA pos    Spec Grav, UA 1.025 1.010 - 1.025   Blood, UA neg    pH, UA  5.5 5.0 - 8.0   Protein, UA Positive (A) Negative   Urobilinogen, UA 0.2 0.2 or 1.0 E.U./dL   Nitrite, UA neg    Leukocytes, UA Trace (A) Negative   Appearance     Odor          Assessment & Plan:   Problem List Items Addressed This Visit       Other   LLQ abdominal pain - Primary   Has been going on for extended period of time.  Patient had increased flare this morning that has been constant.  Last colonoscopy did not show any diverticulosis.  Patient does have a history of multiple colon polyps.  Concern for mass, abscess, diverticulitis.  Stat CT scan abdomen pelvis with contrast.  Pending CBC CMP.  Urinalysis abnormal pending urine culture.  Signs and symptoms reviewed when to seek urgent or emergent healthcare      Relevant Orders   POCT urinalysis dipstick (Completed)   CT ABDOMEN PELVIS W CONTRAST   CBC   Comprehensive metabolic panel   Lipase   Abnormal  urinalysis   Given lower abdominal pain pending urinary culture.      Relevant Orders   Urine Culture    No orders of the defined types were placed in this encounter.   Return if symptoms worsen or fail to improve.  Audria Nine, NP

## 2023-08-17 NOTE — Telephone Encounter (Signed)
Copied from CRM [id number]. Topic: Appointments - Appointment Scheduling >> Aug 17, 2023  9:43 AM Caroline Francis wrote: Patient states she is having severe pain in her left side. Seeking an appointment with Dr. Milinda Antis today if available.  Chief Complaint: Abdominal pain Symptoms: Pain and diarrhea Frequency: 3 weeks, worsening Pertinent Negatives: Patient denies vomiting Disposition: [] ED /[] Urgent Care (no appt availability in office) / [x] Appointment(In office/virtual)/ []  [place] [facility]/ [] Home Care/ [] Refused Recommended Disposition /[] [place] [facility]/ []  Follow-up with PCP Additional Notes: Patient called in to report pain in her abdomen and left side. Patient stated that the pain gradually started 3 weeks ago, but has been worsening. Patient described the pain in her abdomen as is sharp and crampy. Patient stated that the pain radiates to her left leg at times. Patient stated that the pain comes and goes. Patient reported that the pain is present for an hour to an hour and a half at the most. Patient stated that the pain will go away for hours at a time. Patient reported that Tylenol has helped to relieve the pain. Patient stated that she has also been experiencing diarrhea several times a day.  Patient denied vomiting and fever. Advised patient to see a provider within 24 hours. No availability with PCP. Scheduled patient in the office today with a different provider. Advised patient to call back if anything changes between now and her appointment. Patient complied.   Reason for Disposition  [1] MODERATE pain (e.g., interferes with normal activities) AND [2] pain comes and goes (cramps) AND [3] present > 24 hours  (Exception: Pain with Vomiting or Diarrhea - see that Guideline.)  Answer Assessment - Initial Assessment Questions 1. LOCATION: "Where does it hurt?"      Left side in the front and abdomen 2. RADIATION: "Does the pain shoot anywhere else?" (e.g., chest, back)     States  pain sometimes goes down left leg 3. ONSET: "When did the pain begin?" (e.g., minutes, hours or days ago)      3 weeks ago 4. SUDDEN: "Gradual or sudden onset?"     Gradual 5. PATTERN "Does the pain come and go, or is it constant?"    - If it comes and goes: "How long does it last?" "Do you have pain now?"     (Note: Comes and goes means the pain is intermittent. It goes away completely between bouts.)    - If constant: "Is it getting better, staying the same, or getting worse?"      (Note: Constant means the pain never goes away completely; most serious pain is constant and gets worse.)      States pain comes and goes, states pain will go away for hours at a time 6. SEVERITY: "How bad is the pain?"  (e.g., Scale 1-10; mild, moderate, or severe)    - MILD (1-3): Doesn't interfere with normal activities, abdomen soft and not tender to touch.     - MODERATE (4-7): Interferes with normal activities or awakens from sleep, abdomen tender to touch.     - SEVERE (8-10): Excruciating pain, doubled over, unable to do any normal activities.       Rates pain at a 7 currently 7. RECURRENT SYMPTOM: "Have you ever had this type of stomach pain before?" If Yes, ask: "When was the last time?" and "What happened that time?"      Denies 8. CAUSE: "What do you think is causing the stomach pain?"  States she originally thought her medicine for GERD was causing the stomach pain 9. RELIEVING/AGGRAVATING FACTORS: "What makes it better or worse?" (e.g., antacids, bending or twisting motion, bowel movement)     States Tylenol has helped to relieve pain 10. OTHER SYMPTOMS: "Do you have any other symptoms?" (e.g., back pain, diarrhea, fever, urination pain, vomiting)     Diarrhea for a few days-weeks, states she is having diarrhea several times a day, denies vomiting, denies fever, states stomach pain feels sharp and crampy  Protocols used: Abdominal Pain - [facility]

## 2023-08-17 NOTE — Patient Instructions (Signed)
Nice to see you today. I will be in touch with the labs and CT scan results also reviewed them.  Follow-up if you do not improve.

## 2023-08-17 NOTE — Assessment & Plan Note (Signed)
Has been going on for extended period of time.  Patient had increased flare this morning that has been constant.  Last colonoscopy did not show any diverticulosis.  Patient does have a history of multiple colon polyps.  Concern for mass, abscess, diverticulitis.  Stat CT scan abdomen pelvis with contrast.  Pending CBC CMP.  Urinalysis abnormal pending urine culture.  Signs and symptoms reviewed when to seek urgent or emergent healthcare

## 2023-08-17 NOTE — Assessment & Plan Note (Signed)
Given lower abdominal pain pending urinary culture.

## 2023-08-18 LAB — URINE CULTURE
MICRO NUMBER:: 16025887
Result:: NO GROWTH
SPECIMEN QUALITY:: ADEQUATE

## 2023-08-20 ENCOUNTER — Encounter: Payer: Self-pay | Admitting: Nurse Practitioner

## 2023-08-20 ENCOUNTER — Encounter: Payer: Self-pay | Admitting: Family Medicine

## 2023-08-20 DIAGNOSIS — N949 Unspecified condition associated with female genital organs and menstrual cycle: Secondary | ICD-10-CM | POA: Insufficient documentation

## 2023-08-21 ENCOUNTER — Ambulatory Visit: Payer: Self-pay | Admitting: Family Medicine

## 2023-08-21 NOTE — Telephone Encounter (Signed)
I have called patient and reviewed my chart message sent to her by Dr. Milinda Antis. She will call if any changes in symptoms or further questions.

## 2023-08-21 NOTE — Telephone Encounter (Signed)
Most cysts will go away or shrink on their own If not symptomatic we can re check it as planned and go from there  If it enlarges / becomes symptomatic or changes / then a gyn may have to do something

## 2023-08-21 NOTE — Telephone Encounter (Signed)
 Patient called in stating she was diagnosed with an ovarian cyst on her CT scan on Friday after intermittent pain. Patient was told by Dr. Wendee she would be referred to Dr. Randeen for further treatment. Patients at this time she is not experiencing any pain, pressure, or any other symptoms. Patient would like to know if she needs to come into office for evaluation or if she can treat this at home. Patient would like call back.   Copied from CRM [id number]. Topic: General - Other >> Aug 21, 2023 12:43 PM Victoria A wrote: Reason for CRM: Patient called said she went to have scan did yesterday and has a cyst on left ovary-wants to know what to do Reason for Disposition  [1] Other NON-URGENT information for PCP AND [2] does not require PCP response  Answer Assessment - Initial Assessment Questions 1. REASON FOR CALL or QUESTION: What is your reason for calling today? or How can I best help you? or What question do you have that I can help answer?     Patient states she was confirmed with ovarian cyst on CT scan on Friday and was told she would be referred to Dr. Randeen. 2. CALLER: Document the source of call. (e.g., laboratory, patient).     Patient  Protocols used: PCP Call - No Triage-A-AH

## 2023-08-21 NOTE — Telephone Encounter (Signed)
Copied from CRM [id number]. Topic: Clinical - Medical Advice >> Aug 21, 2023  1:28 PM Isabell A wrote: Reason for CRM: Patient would like to know if she should have her cyst removed.

## 2023-08-22 NOTE — Telephone Encounter (Signed)
 Pt notified of Dr. Royden Purl comments

## 2023-09-03 ENCOUNTER — Ambulatory Visit: Payer: Medicare Other | Admitting: Anesthesiology

## 2023-09-03 ENCOUNTER — Encounter
Admission: RE | Disposition: A | Discharge status: Home / Self Care | Payer: Self-pay | Source: Home / Self Care | Attending: Gastroenterology

## 2023-09-03 ENCOUNTER — Encounter: Payer: Self-pay | Admitting: Anesthesiology

## 2023-09-03 ENCOUNTER — Ambulatory Visit
Admission: RE | Admit: 2023-09-03 | Discharge: 2023-09-03 | Disposition: A | Discharge status: Home / Self Care | Payer: Medicare Other | Attending: Gastroenterology | Admitting: Gastroenterology

## 2023-09-03 DIAGNOSIS — K449 Diaphragmatic hernia without obstruction or gangrene: Secondary | ICD-10-CM | POA: Diagnosis not present

## 2023-09-03 DIAGNOSIS — Q438 Other specified congenital malformations of intestine: Secondary | ICD-10-CM | POA: Diagnosis not present

## 2023-09-03 DIAGNOSIS — I10 Essential (primary) hypertension: Secondary | ICD-10-CM | POA: Insufficient documentation

## 2023-09-03 DIAGNOSIS — Z860101 Personal history of adenomatous and serrated colon polyps: Secondary | ICD-10-CM | POA: Diagnosis not present

## 2023-09-03 DIAGNOSIS — Z09 Encounter for follow-up examination after completed treatment for conditions other than malignant neoplasm: Secondary | ICD-10-CM | POA: Diagnosis present

## 2023-09-03 DIAGNOSIS — K222 Esophageal obstruction: Secondary | ICD-10-CM | POA: Insufficient documentation

## 2023-09-03 DIAGNOSIS — K640 First degree hemorrhoids: Secondary | ICD-10-CM | POA: Diagnosis not present

## 2023-09-03 HISTORY — PX: BALLOON DILATION: SHX5330

## 2023-09-03 HISTORY — PX: COLONOSCOPY WITH PROPOFOL: SHX5780

## 2023-09-03 HISTORY — PX: ESOPHAGOGASTRODUODENOSCOPY (EGD) WITH PROPOFOL: SHX5813

## 2023-09-03 SURGERY — COLONOSCOPY WITH PROPOFOL
Anesthesia: General

## 2023-09-03 MED ORDER — LIDOCAINE HCL (CARDIAC) PF 100 MG/5ML IV SOSY
PREFILLED_SYRINGE | INTRAVENOUS | Status: DC | PRN
Start: 2023-09-03 — End: 2023-09-03
  Administered 2023-09-03: 80 mg via INTRAVENOUS

## 2023-09-03 MED ORDER — PROPOFOL 1000 MG/100ML IV EMUL
INTRAVENOUS | Status: AC
  Filled 2023-09-03: qty 100

## 2023-09-03 MED ORDER — PROPOFOL 500 MG/50ML IV EMUL
INTRAVENOUS | Status: DC | PRN
  Administered 2023-09-03: 150 ug/kg/min via INTRAVENOUS

## 2023-09-03 MED ORDER — PROPOFOL 10 MG/ML IV BOLUS
INTRAVENOUS | Status: DC | PRN
  Administered 2023-09-03: 75 mg via INTRAVENOUS

## 2023-09-03 MED ORDER — LIDOCAINE HCL (PF) 2 % IJ SOLN
INTRAMUSCULAR | Status: AC
  Filled 2023-09-03: qty 5

## 2023-09-03 MED ORDER — SODIUM CHLORIDE 0.9 % IV SOLN
INTRAVENOUS | Status: DC
  Administered 2023-09-03: 20 mL/h via INTRAVENOUS

## 2023-09-03 NOTE — Transfer of Care (Signed)
Immediate Anesthesia Transfer of Care Note  Patient: Caroline Francis  Procedure(s) Performed: COLONOSCOPY WITH PROPOFOL ESOPHAGOGASTRODUODENOSCOPY (EGD) WITH PROPOFOL BALLOON DILATION  Patient Location: PACU  Anesthesia Type:General  Level of Consciousness: awake and sedated  Airway & Oxygen Therapy: Patient Spontanous Breathing and Patient connected to nasal cannula oxygen  Post-op Assessment: Report given to RN and Post -op Vital signs reviewed and stable  Post vital signs: Reviewed  Last Vitals:  Vitals Value Taken Time  BP    Temp    Pulse    Resp    SpO2      Last Pain:  Vitals:   09/03/23 1105  TempSrc: Temporal  PainSc: 0-No pain         Complications: There were no known notable events for this encounter.

## 2023-09-03 NOTE — Anesthesia Postprocedure Evaluation (Signed)
Anesthesia Post Note  Patient: NALEAH KOFOED  Procedure(s) Performed: COLONOSCOPY WITH PROPOFOL ESOPHAGOGASTRODUODENOSCOPY (EGD) WITH PROPOFOL BALLOON DILATION  Patient location during evaluation: Endoscopy Anesthesia Type: General Level of consciousness: awake and alert Pain management: pain level controlled Vital Signs Assessment: post-procedure vital signs reviewed and stable Respiratory status: spontaneous breathing, nonlabored ventilation, respiratory function stable and patient connected to nasal cannula oxygen Cardiovascular status: blood pressure returned to baseline and stable Postop Assessment: no apparent nausea or vomiting Anesthetic complications: no   There were no known notable events for this encounter.   Last Vitals:  Vitals:   09/03/23 1105 09/03/23 1213  BP: 114/72   Pulse: 64   Resp: 20   Temp: (!) 36 C (!) 36 C  SpO2: 99%     Last Pain:  Vitals:   09/03/23 1233  TempSrc:   PainSc: 0-No pain                 Louie Boston

## 2023-09-03 NOTE — H&P (Signed)
Outpatient short stay form Pre-procedure 09/03/2023  [name], MD  Primary Physician: Tower, Audrie Gallus, MD  Reason for visit:  Dysphagia/Surveillance colonoscopy  History of present illness:    73 y/o lady with history of hypertension and HLD here for EGD/Colonoscopy for dysphagia and surveillance colonoscopy. Last colonoscopy 1 year ago with > 10 adenomatous polyps. No blood thinners. No family history of GI malignancies. No significant abdominal surgeries.    Current Facility-Administered Medications:    0.9 %  sodium chloride infusion, , Intravenous, Continuous, Dominque Marlin, Rossie Muskrat, MD, Last Rate: 20 mL/hr at 09/03/23 1115, 20 mL/hr at 09/03/23 1115  Medications Prior to Admission  Medication Sig Dispense Refill Last Dose/Taking   cholecalciferol (VITAMIN D3) 25 MCG (1000 UNIT) tablet Take 1,000 Units by mouth daily as needed.   Past Week   hydrochlorothiazide (HYDRODIURIL) 25 MG tablet Take 1 tablet (25 mg total) by mouth daily. 90 tablet 3 Past Week   lansoprazole (PREVACID) 30 MG capsule Take 1 capsule (30 mg total) by mouth daily as needed. 90 capsule 3 Past Week   olmesartan (BENICAR) 40 MG tablet Take 1 tablet (40 mg total) by mouth daily. 90 tablet 3 Past Week   Omega-3 Fatty Acids (FISH OIL) 500 MG CAPS Take 500 mg by mouth daily as needed.   Past Week   TURMERIC CURCUMIN PO Take 1 capsule by mouth daily as needed.   Past Week   vitamin B-12 (CYANOCOBALAMIN) 1000 MCG tablet Take 1,000 mcg by mouth daily as needed.   Past Week     Allergies  Allergen Reactions   Lisinopril Cough     Past Medical History:  Diagnosis Date   GERD (gastroesophageal reflux disease)    Hyperlipidemia    Hypertension    Low TSH level     Review of systems:  Otherwise negative.    Physical Exam  Gen: Alert, oriented. Appears stated age.  HEENT: PERRLA. Lungs: No respiratory distress CV: RRR Abd: soft, benign, no masses Ext: No edema    Planned procedures: Proceed with  EGD/colonoscopy. The patient understands the nature of the planned procedure, indications, risks, alternatives and potential complications including but not limited to bleeding, infection, perforation, damage to internal organs and possible oversedation/side effects from anesthesia. The patient agrees and gives consent to proceed.  Please refer to procedure notes for findings, recommendations and patient disposition/instructions.     [name], MD [facility]

## 2023-09-03 NOTE — Op Note (Signed)
[facility] Gastroenterology Patient Name: Caroline Francis Procedure Date: 09/03/2023 11:32 AM MRN: [id number] Account #: [id number] Date of Birth: 1951/03/31 Admit Type: Outpatient Age: 73 Room: [facility] ENDO ROOM 3 Gender: Female Note Status: Finalized Instrument Name: Patton Salles Endoscope [id number] Procedure:             Upper GI endoscopy Indications:           Dysphagia Providers:             Eather Colas MD, MD Medicines:             Monitored Anesthesia Care Complications:         No immediate complications. Estimated blood loss:                         Minimal. Procedure:             Pre-Anesthesia Assessment:                        - Prior to the procedure, a History and Physical was                         performed, and patient medications and allergies were                         reviewed. The patient is competent. The risks and                         benefits of the procedure and the sedation options and                         risks were discussed with the patient. All questions                         were answered and informed consent was obtained.                         Patient identification and proposed procedure were                         verified by the physician, the nurse, the                         anesthesiologist, the anesthetist and the technician                         in the endoscopy suite. Mental Status Examination:                         alert and oriented. Airway Examination: normal                         oropharyngeal airway and neck mobility. Respiratory                         Examination: clear to auscultation. CV Examination:                         normal. Prophylactic Antibiotics: The patient does not  require prophylactic antibiotics. Prior                         Anticoagulants: The patient has taken no anticoagulant                         or antiplatelet agents. ASA Grade Assessment: II - A                          patient with mild systemic disease. After reviewing                         the risks and benefits, the patient was deemed in                         satisfactory condition to undergo the procedure. The                         anesthesia plan was to use monitored anesthesia care                         (MAC). Immediately prior to administration of                         medications, the patient was re-assessed for adequacy                         to receive sedatives. The heart rate, respiratory                         rate, oxygen saturations, blood pressure, adequacy of                         pulmonary ventilation, and response to care were                         monitored throughout the procedure. The physical                         status of the patient was re-assessed after the                         procedure.                        After obtaining informed consent, the endoscope was                         passed under direct vision. Throughout the procedure,                         the patient's blood pressure, pulse, and oxygen                         saturations were monitored continuously. The Endoscope                         was introduced through the mouth, and advanced to the  second part of duodenum. The upper GI endoscopy was                         accomplished without difficulty. The patient tolerated                         the procedure well. Findings:      A non-obstructing Schatzki ring was found in the lower third of the       esophagus. A TTS dilator was passed through the scope. Dilation with a       15-16.5-18 mm balloon dilator was performed to 18 mm. The dilation site       was examined and showed mild mucosal disruption. Estimated blood loss       was minimal.      A 3 cm hiatal hernia was present.      The entire examined stomach was normal.      The examined duodenum was normal. Impression:            -  Non-obstructing Schatzki ring. Dilated.                        - 3 cm hiatal hernia.                        - Normal stomach.                        - Normal examined duodenum.                        - No specimens collected. Recommendation:        - Discharge patient to home.                        - Resume previous diet.                        - Continue present medications.                        - Return to referring physician as previously                         scheduled. Procedure Code(s):     --- Professional ---                        [phone], Esophagogastroduodenoscopy, flexible,                         transoral; with transendoscopic balloon dilation of                         esophagus (less than 30 mm diameter) Diagnosis Code(s):     --- Professional ---                        K22.2, Esophageal obstruction                        K44.9, Diaphragmatic hernia without obstruction or  gangrene                        R13.10, Dysphagia, unspecified CPT copyright 2022 American Medical Association. All rights reserved. The codes documented in this report are preliminary and upon coder review may  be revised to meet current compliance requirements. Eather Colas MD, MD 09/03/2023 12:16:01 PM Number of Addenda: 0 Note Initiated On: 09/03/2023 11:32 AM Estimated Blood Loss:  Estimated blood loss was minimal.      [facility]

## 2023-09-03 NOTE — Interval H&P Note (Signed)
History and Physical Interval Note:  09/03/2023 11:40 AM  Caroline Francis  has presented today for surgery, with the diagnosis of h/o TA polyps GERD Dysphagia.  The various methods of treatment have been discussed with the patient and family. After consideration of risks, benefits and other options for treatment, the patient has consented to  Procedure(s): COLONOSCOPY WITH PROPOFOL (N/A) ESOPHAGOGASTRODUODENOSCOPY (EGD) WITH PROPOFOL (N/A) as a surgical intervention.  The patient's history has been reviewed, patient examined, no change in status, stable for surgery.  I have reviewed the patient's chart and labs.  Questions were answered to the patient's satisfaction.     [name]  Ok to proceed with EGD/Colonoscopy

## 2023-09-03 NOTE — Anesthesia Preprocedure Evaluation (Signed)
Anesthesia Evaluation  Patient identified by MRN, date of birth, ID band Patient awake    Reviewed: Allergy & Precautions, NPO status , Patient's Chart, lab work & pertinent test results  History of Anesthesia Complications Negative for: history of anesthetic complications  Airway Mallampati: III  TM Distance: >3 FB Neck ROM: full    Dental no notable dental hx.    Pulmonary neg pulmonary ROS   Pulmonary exam normal        Cardiovascular hypertension, On Medications negative cardio ROS Normal cardiovascular exam     Neuro/Psych negative neurological ROS  negative psych ROS   GI/Hepatic Neg liver ROS,GERD  Medicated,,  Endo/Other  negative endocrine ROS    Renal/GU negative Renal ROS  negative genitourinary   Musculoskeletal   Abdominal   Peds  Hematology negative hematology ROS (+)   Anesthesia Other Findings Past Medical History: No date: GERD (gastroesophageal reflux disease) No date: Hyperlipidemia No date: Hypertension No date: Low TSH level  Past Surgical History: No date: ABDOMINAL HYSTERECTOMY     Comment:  partial /bleeding 08/18/2022: COLONOSCOPY WITH PROPOFOL; N/A     Comment:  Procedure: COLONOSCOPY WITH PROPOFOL;  Surgeon:               [name], MD;  Location: [facility] ENDOSCOPY;                Service: Endoscopy;  Laterality: N/A; 08/18/2022: ESOPHAGOGASTRODUODENOSCOPY; N/A     Comment:  Procedure: ESOPHAGOGASTRODUODENOSCOPY (EGD);  Surgeon:               [name], MD;  Location: [facility] ENDOSCOPY;                Service: Endoscopy;  Laterality: N/A; No date: NASAL SEPTUM SURGERY 05/10/2021: REVERSE SHOULDER ARTHROPLASTY; Right     Comment:  Procedure: REVERSE SHOULDER ARTHROPLASTY;  Surgeon:               [name], MD;  Location: [facility] ORS;  Service:               Orthopedics;  Laterality: Right;  BMI    Body Mass Index: 25.15 kg/m       Reproductive/Obstetrics negative OB ROS                             Anesthesia Physical Anesthesia Plan  ASA: 2  Anesthesia Plan: General   Post-op Pain Management: Minimal or no pain anticipated   Induction: Intravenous  PONV Risk Score and Plan: 1 and Propofol infusion and TIVA  Airway Management Planned: Natural Airway and Nasal Cannula  Additional Equipment:   Intra-op Plan:   Post-operative Plan:   Informed Consent: I have reviewed the patients History and Physical, chart, labs and discussed the procedure including the risks, benefits and alternatives for the proposed anesthesia with the patient or authorized representative who has indicated his/her understanding and acceptance.     Dental Advisory Given  Plan Discussed with: Anesthesiologist, CRNA and Surgeon  Anesthesia Plan Comments: (Patient consented for risks of anesthesia including but not limited to:  - adverse reactions to medications - risk of airway placement if required - damage to eyes, teeth, lips or other oral mucosa - nerve damage due to positioning  - sore throat or hoarseness - Damage to heart, brain, nerves, lungs, other parts of body or loss of life  Patient voiced understanding and assent.)  Anesthesia Quick Evaluation

## 2023-09-03 NOTE — Op Note (Signed)
[facility] Gastroenterology Patient Name: Caroline Francis Procedure Date: 09/03/2023 11:32 AM MRN: [id number] Account #: [id number] Date of Birth: August 29, 1950 Admit Type: Outpatient Age: 73 Room: [facility] ENDO ROOM 3 Gender: Female Note Status: Finalized Instrument Name: Prentice Docker [id number] Procedure:             Colonoscopy Indications:           Surveillance: History of numerous (> 10) adenomas on                         last colonoscopy (< 3 yrs) Providers:             Eather Colas MD, MD Medicines:             Monitored Anesthesia Care Complications:         No immediate complications. Procedure:             Pre-Anesthesia Assessment:                        - Prior to the procedure, a History and Physical was                         performed, and patient medications and allergies were                         reviewed. The patient is competent. The risks and                         benefits of the procedure and the sedation options and                         risks were discussed with the patient. All questions                         were answered and informed consent was obtained.                         Patient identification and proposed procedure were                         verified by the physician, the nurse, the                         anesthesiologist, the anesthetist and the technician                         in the endoscopy suite. Mental Status Examination:                         alert and oriented. Airway Examination: normal                         oropharyngeal airway and neck mobility. Respiratory                         Examination: clear to auscultation. CV Examination:                         normal. Prophylactic Antibiotics: The patient does not  require prophylactic antibiotics. Prior                         Anticoagulants: The patient has taken no anticoagulant                         or antiplatelet agents. ASA  Grade Assessment: II - A                         patient with mild systemic disease. After reviewing                         the risks and benefits, the patient was deemed in                         satisfactory condition to undergo the procedure. The                         anesthesia plan was to use monitored anesthesia care                         (MAC). Immediately prior to administration of                         medications, the patient was re-assessed for adequacy                         to receive sedatives. The heart rate, respiratory                         rate, oxygen saturations, blood pressure, adequacy of                         pulmonary ventilation, and response to care were                         monitored throughout the procedure. The physical                         status of the patient was re-assessed after the                         procedure.                        After obtaining informed consent, the colonoscope was                         passed under direct vision. Throughout the procedure,                         the patient's blood pressure, pulse, and oxygen                         saturations were monitored continuously. The                         Colonoscope was introduced through the anus and  advanced to the the cecum, identified by appendiceal                         orifice and ileocecal valve. The colonoscopy was                         somewhat difficult due to a redundant colon and                         significant looping. Successful completion of the                         procedure was aided by applying abdominal pressure.                         The patient tolerated the procedure well. The quality                         of the bowel preparation was adequate to identify                         polyps. The ileocecal valve, appendiceal orifice, and                         rectum were photographed. Findings:      The  perianal and digital rectal examinations were normal.      Internal hemorrhoids were found during retroflexion. The hemorrhoids       were Grade I (internal hemorrhoids that do not prolapse).      The exam was otherwise without abnormality on direct and retroflexion       views. Impression:            - Internal hemorrhoids.                        - The examination was otherwise normal on direct and                         retroflexion views.                        - No specimens collected. Recommendation:        - Discharge patient to home.                        - Resume previous diet.                        - Continue present medications.                        - Repeat colonoscopy in 3 years for surveillance.                        - Return to referring physician as previously                         scheduled. Procedure Code(s):     --- Professional ---  G0105, Colorectal cancer screening; colonoscopy on                         individual at high risk Diagnosis Code(s):     --- Professional ---                        Z86.010, Personal history of colonic polyps                        K64.0, First degree hemorrhoids CPT copyright 2022 American Medical Association. All rights reserved. The codes documented in this report are preliminary and upon coder review may  be revised to meet current compliance requirements. Eather Colas MD, MD 09/03/2023 12:20:38 PM Number of Addenda: 0 Note Initiated On: 09/03/2023 11:32 AM Scope Withdrawal Time: 0 hours 6 minutes 31 seconds  Total Procedure Duration: 0 hours 13 minutes 15 seconds  Estimated Blood Loss:  Estimated blood loss: none.      [facility]

## 2023-09-04 ENCOUNTER — Encounter: Payer: Self-pay | Admitting: Gastroenterology

## 2023-11-12 ENCOUNTER — Other Ambulatory Visit (INDEPENDENT_AMBULATORY_CARE_PROVIDER_SITE_OTHER): Payer: Medicare Other

## 2023-11-12 ENCOUNTER — Encounter: Payer: Self-pay | Admitting: Family Medicine

## 2023-11-12 DIAGNOSIS — E7800 Pure hypercholesterolemia, unspecified: Secondary | ICD-10-CM | POA: Diagnosis not present

## 2023-11-12 LAB — LIPID PANEL
Cholesterol: 224 mg/dL — ABNORMAL HIGH (ref 0–200)
HDL: 72.3 mg/dL (ref >39.00)
LDL Cholesterol: 129 mg/dL — ABNORMAL HIGH (ref 0–99)
NonHDL: 151.39
Total CHOL/HDL Ratio: 3
Triglycerides: 111 mg/dL (ref 0.0–149.0)
VLDL: 22.2 mg/dL (ref 0.0–40.0)

## 2023-11-13 ENCOUNTER — Encounter: Payer: Self-pay | Admitting: *Deleted

## 2024-03-13 ENCOUNTER — Ambulatory Visit: Payer: Medicare Other

## 2024-03-13 VITALS — Ht 66.5 in | Wt 155.0 lb

## 2024-03-13 DIAGNOSIS — Z0000 Encounter for general adult medical examination without abnormal findings: Secondary | ICD-10-CM

## 2024-03-13 NOTE — Progress Notes (Signed)
 Please attest and cosign this visit due to patients primary care provider not being in the office at the time the visit was completed.    Subjective:   Caroline Francis is a 73 y.o. who presents for a Medicare Wellness preventive visit.  As a reminder, Annual Wellness Visits don't include a physical exam, and some assessments may be limited, especially if this visit is performed virtually. We may recommend an in-person follow-up visit with your provider if needed.  Visit Complete: Virtual I connected with  Caroline Francis on 03/13/24 by a audio enabled telemedicine application and verified that I am speaking with the correct person using two identifiers.  Patient Location: Home  Provider Location: Home Office  I discussed the limitations of evaluation and management by telemedicine. The patient expressed understanding and agreed to proceed.  Vital Signs: Because this visit was a virtual/telehealth visit, some criteria may be missing or patient reported. Any vitals not documented were not able to be obtained and vitals that have been documented are patient reported.  VideoDeclined- This patient declined Librarian, academic. Therefore the visit was completed with audio only.  Persons Participating in Visit: Patient.  AWV Questionnaire: No: Patient Medicare AWV questionnaire was not completed prior to this visit.  Cardiac Risk Factors include: advanced age (>109men, >67 women);dyslipidemia;hypertension     Objective:    Today's Vitals   03/13/24 1507  Weight: 155 lb (70.3 kg)  Height: 5' 6.5 (1.689 m)   Body mass index is 24.64 kg/m.     03/13/2024    3:16 PM 09/03/2023   11:04 AM 03/07/2023    1:01 PM 08/18/2022    9:58 AM 01/31/2022    8:53 AM 05/10/2021    8:45 AM 05/06/2021    8:38 AM  Advanced Directives  Does Patient Have a Medical Advance Directive? Yes No;Yes Yes No No Yes Yes  Type of Estate agent of [place];Living  will Healthcare Power of [place];Living will Healthcare Power of [place];Living will   Healthcare Power of [place];Living will Healthcare Power of [place];Living will  Does patient want to make changes to medical advance directive?       No - Patient declined  Copy of Healthcare Power of Attorney in Chart? No - copy requested  No - copy requested    No - copy requested  Would patient like information on creating a medical advance directive?     No - Patient declined      Current Medications (verified) Outpatient Encounter Medications as of 03/13/2024  Medication Sig   cholecalciferol (VITAMIN D3) 25 MCG (1000 UNIT) tablet Take 1,000 Units by mouth daily as needed.   hydrochlorothiazide  (HYDRODIURIL ) 25 MG tablet Take 1 tablet (25 mg total) by mouth daily.   olmesartan  (BENICAR ) 40 MG tablet Take 1 tablet (40 mg total) by mouth daily.   vitamin B-12 (CYANOCOBALAMIN ) 1000 MCG tablet Take 1,000 mcg by mouth daily as needed.   lansoprazole  (PREVACID ) 30 MG capsule Take 1 capsule (30 mg total) by mouth daily as needed.   Omega-3 Fatty Acids (FISH OIL) 500 MG CAPS Take 500 mg by mouth daily as needed.   TURMERIC CURCUMIN PO Take 1 capsule by mouth daily as needed.   No facility-administered encounter medications on file as of 03/13/2024.    Allergies (verified) Lisinopril   History: Past Medical History:  Diagnosis Date   GERD (gastroesophageal reflux disease)    Hyperlipidemia    Hypertension    Low  TSH level    Past Surgical History:  Procedure Laterality Date   ABDOMINAL HYSTERECTOMY     partial /bleeding   BALLOON DILATION  09/03/2023   Procedure: BALLOON DILATION;  Surgeon: Maryruth Ole DASEN, MD;  Location: [facility] ENDOSCOPY;  Service: Endoscopy;;   COLONOSCOPY WITH PROPOFOL  N/A 08/18/2022   Procedure: COLONOSCOPY WITH PROPOFOL ;  Surgeon: Maryruth Ole DASEN, MD;  Location: [facility] ENDOSCOPY;  Service: Endoscopy;  Laterality: N/A;   COLONOSCOPY WITH PROPOFOL  N/A 09/03/2023    Procedure: COLONOSCOPY WITH PROPOFOL ;  Surgeon: Maryruth Ole DASEN, MD;  Location: [facility] ENDOSCOPY;  Service: Endoscopy;  Laterality: N/A;   ESOPHAGOGASTRODUODENOSCOPY N/A 08/18/2022   Procedure: ESOPHAGOGASTRODUODENOSCOPY (EGD);  Surgeon: Maryruth Ole DASEN, MD;  Location: [facility] ENDOSCOPY;  Service: Endoscopy;  Laterality: N/A;   ESOPHAGOGASTRODUODENOSCOPY (EGD) WITH PROPOFOL  N/A 09/03/2023   Procedure: ESOPHAGOGASTRODUODENOSCOPY (EGD) WITH PROPOFOL ;  Surgeon: Maryruth Ole DASEN, MD;  Location: [facility] ENDOSCOPY;  Service: Endoscopy;  Laterality: N/A;   NASAL SEPTUM SURGERY     REVERSE SHOULDER ARTHROPLASTY Right 05/10/2021   Procedure: REVERSE SHOULDER ARTHROPLASTY;  Surgeon: Edie Norleen PARAS, MD;  Location: [facility] ORS;  Service: Orthopedics;  Laterality: Right;   Family History  Problem Relation Age of Onset   Breast cancer Neg Hx    Social History   Socioeconomic History   Marital status: Divorced    Spouse name: Not on file   Number of children: Not on file   Years of education: Not on file   Highest education level: Some college, no degree  Occupational History   Not on file  Tobacco Use   Smoking status: Never   Smokeless tobacco: Never  Vaping Use   Vaping status: Never Used  Substance and Sexual Activity   Alcohol use: Yes    Comment: occasional glass of wine   Drug use: No   Sexual activity: Not on file  Other Topics Concern   Not on file  Social History Narrative   Not on file   Social Drivers of Health   Financial Resource Strain: Low Risk  (03/13/2024)   Overall Financial Resource Strain (CARDIA)    Difficulty of Paying Living Expenses: Not hard at all  Food Insecurity: No Food Insecurity (03/13/2024)   Hunger Vital Sign    Worried About Running Out of Food in the Last Year: Never true    Ran Out of Food in the Last Year: Never true  Transportation Needs: No Transportation Needs (03/13/2024)   PRAPARE - Administrator, Civil Service (Medical): No    Lack  of Transportation (Non-Medical): No  Physical Activity: Sufficiently Active (03/13/2024)   Exercise Vital Sign    Days of Exercise per Week: 5 days    Minutes of Exercise per Session: 60 min  Stress: No Stress Concern Present (03/13/2024)   [employer] of Occupational Health - Occupational Stress Questionnaire    Feeling of Stress: Not at all  Social Connections: Socially Isolated (03/13/2024)   Social Connection and Isolation Panel    Frequency of Communication with Friends and Family: More than three times a week    Frequency of Social Gatherings with Friends and Family: More than three times a week    Attends Religious Services: Never    Database administrator or Organizations: No    Attends Banker Meetings: Never    Marital Status: Divorced    Tobacco Counseling Counseling given: Not Answered    Clinical Intake:  Pre-visit preparation completed: Yes  Pain :  No/denies pain     BMI - recorded: 24.64 Nutritional Status: BMI of 19-24  Normal Nutritional Risks: None Diabetes: No  Lab Results  Component Value Date   HGBA1C 5.5 08/07/2023   HGBA1C 5.2 03/30/2022   HGBA1C 5.4 07/28/2020     How often do you need to have someone help you when you read instructions, pamphlets, or other written materials from your doctor or pharmacy?: 1 - Never  Interpreter Needed?: No  Comments: lives alone Information entered by :: B.Kevonte Vanecek,LPN   Activities of Daily Living     03/13/2024    3:18 PM  In your present state of health, do you have any difficulty performing the following activities:  Hearing? 0  Vision? 0  Difficulty concentrating or making decisions? 0  Walking or climbing stairs? 0  Dressing or bathing? 0  Doing errands, shopping? 0  Preparing Food and eating ? N  Using the Toilet? N  In the past six months, have you accidently leaked urine? N  Do you have problems with loss of bowel control? N  Managing your Medications? N  Managing your  Finances? N  Housekeeping or managing your Housekeeping? N    Patient Care Team: [place], Laine LABOR, MD as PCP - [facility]  I have updated your Care Teams any recent Medical Services you may have received from other providers in the past year.     Assessment:   This is a routine wellness examination for [place].  Hearing/Vision screen Hearing Screening - Comments:: Patient denies any hearing difficulties.   Vision Screening - Comments:: Pt says their vision is good with contacts [facility] [place]-last dec 2024   Goals Addressed             This Visit's Progress    DIET - EAT MORE FRUITS AND VEGETABLES   On track    03/13/24     Patient Stated   On track    03/13/24- I will continue to walk my dog everyday for 1-2 miles.      Patient Stated       03/13/24-Lose 10-15 pounds       Depression Screen     03/13/2024    3:13 PM 08/07/2023    4:08 PM 03/07/2023   12:55 PM 01/31/2022    8:51 AM 07/21/2020    8:58 AM 03/03/2019    3:23 PM 09/19/2017    5:41 PM  PHQ 2/9 Scores  PHQ - 2 Score 0 0 0 0 0 0 1  PHQ- 9 Score  0  0 0      Fall Risk     03/13/2024    3:10 PM 08/07/2023    4:08 PM 03/07/2023    7:52 AM 02/01/2023    8:43 AM 01/31/2022    8:53 AM  Fall Risk   Falls in the past year? 0 0 0 0 0  Number falls in past yr: 0 0 0 0 0  Injury with Fall? 0 0 0 0 0  Risk for fall due to : No Fall Risks No Fall Risks No Fall Risks  No Fall Risks  Follow up Education provided;Falls prevention discussed Falls evaluation completed Falls prevention discussed;Falls evaluation completed  Falls evaluation completed      Data saved with a previous flowsheet row definition    MEDICARE RISK AT HOME:  Medicare Risk at Home Any stairs in or around the home?: Yes If so, are there any without handrails?: Yes  Home free of loose throw rugs in walkways, pet beds, electrical cords, etc?: Yes Adequate lighting in your home to reduce risk of falls?: Yes Life  alert?: No Use of a cane, walker or w/c?: No Grab bars in the bathroom?: No Shower chair or bench in shower?: Yes Elevated toilet seat or a handicapped toilet?: No  TIMED UP AND GO:  Was the test performed?  No  Cognitive Function: 6CIT completed    07/21/2020    8:59 AM  MMSE - Mini Mental State Exam  Orientation to time 5  Orientation to Place 5  Registration 3  Attention/ Calculation 5  Recall 3  Language- repeat 1        03/13/2024    3:19 PM 03/07/2023    1:03 PM 01/31/2022    8:55 AM  6CIT Screen  What Year? 0 points 0 points 0 points  What month? 0 points 0 points 0 points  What time? 0 points 0 points 0 points  Count back from 20 0 points 0 points 0 points  Months in reverse 0 points 4 points 0 points  Repeat phrase 0 points 0 points 0 points  Total Score 0 points 4 points 0 points    Immunizations Immunization History  Administered Date(s) Administered   Fluad Quad(high Dose 65+) 05/09/2022   INFLUENZA, HIGH DOSE SEASONAL PF 05/06/2019, 03/21/2023   Influenza-Unspecified 08/03/2017, 06/16/2018, 04/27/2020   Moderna Covid-19 Fall Seasonal Vaccine 60yrs & older 03/21/2023   Moderna SARS-COV2 Booster Vaccination 10/07/2021, 10/07/2021   Moderna Sars-Covid-2 Vaccination 12/24/2020   PFIZER(Purple Top)SARS-COV-2 Vaccination 08/28/2019, 09/18/2019, 04/27/2020   PNEUMOCOCCAL CONJUGATE-20 10/07/2021, 05/21/2022   Pneumococcal Conjugate-13 09/19/2017   Pneumococcal Polysaccharide-23 03/03/2019   Respiratory Syncytial Virus Vaccine,Recomb Aduvanted(Arexvy) 05/21/2022   Td 12/22/2003   Tdap 04/15/2014, 07/26/2017   Zoster Recombinant(Shingrix) 09/21/2017, 12/28/2017   Zoster, Live 05/01/2012    Screening Tests Health Maintenance  Topic Date Due   COVID-19 Vaccine (6 - Pfizer risk 2024-25 season) 09/18/2023   INFLUENZA VACCINE  02/15/2024   Hepatitis C Screening  09/20/2027 (Originally 03/22/1969)   MAMMOGRAM  04/08/2024   Colonoscopy  09/02/2024   Medicare  Annual Wellness (AWV)  03/13/2025   DTaP/Tdap/Td (4 - Td or Tdap) 07/27/2027   Pneumococcal Vaccine: 50+ Years  Completed   DEXA SCAN  Completed   Zoster Vaccines- Shingrix  Completed   HPV VACCINES  Aged Out   Meningococcal B Vaccine  Aged Out    Health Maintenance  Health Maintenance Due  Topic Date Due   COVID-19 Vaccine (6 - Pfizer risk 2024-25 season) 09/18/2023   INFLUENZA VACCINE  02/15/2024   Health Maintenance Items Addressed: None due at this time. Pt will receive vaccines at their pharmacy when decided to obtain  Additional Screening:  Vision Screening: Recommended annual ophthalmology exams for early detection of glaucoma and other disorders of the eye. Would you like a referral to an eye doctor? No    Dental Screening: Recommended annual dental exams for proper oral hygiene  Community Resource Referral / Chronic Care Management: CRR required this visit?  No   CCM required this visit?  Appt scheduled with PCP   Plan:    I have personally reviewed and noted the following in the patient's chart:   Medical and social history Use of alcohol, tobacco or illicit drugs  Current medications and supplements including opioid prescriptions. Patient is not currently taking opioid prescriptions. Functional ability and status Nutritional status Physical activity Advanced directives List of other physicians  Hospitalizations, surgeries, and ER visits in previous 12 months Vitals Screenings to include cognitive, depression, and falls Referrals and appointments  In addition, I have reviewed and discussed with patient certain preventive protocols, quality metrics, and best practice recommendations. A written personalized care plan for preventive services as well as general preventive health recommendations were provided to patient.   Erminio LITTIE Saris, LPN   1/71/7974   After Visit Summary: (MyChart) Due to this being a telephonic visit, the after visit summary with  patients personalized plan was offered to patient via MyChart   Notes: Nothing significant to report at this time.

## 2024-03-13 NOTE — Patient Instructions (Signed)
 [name] , Thank you for taking time out of your busy schedule to complete your Annual Wellness Visit with me. I enjoyed our conversation and look forward to speaking with you again next year. I, as well as your care team,  appreciate your ongoing commitment to your health goals. Please review the following plan we discussed and let me know if I can assist you in the future. Your Game plan/ To Do List    Referrals: If you haven't heard from the office you've been referred to, please reach out to them at the phone provided.   Follow up Visits: We will see or speak with you next year for your Next Medicare AWV with our clinical staff Have you seen your provider in the last 6 months (3 months if uncontrolled diabetes)? Yes  Clinician Recommendations:  Aim for 30 minutes of exercise or brisk walking, 6-8 glasses of water , and 5 servings of fruits and vegetables each day.       This is a list of the screenings recommended for you:  Health Maintenance  Topic Date Due   COVID-19 Vaccine (6 - Pfizer risk 2024-25 season) 09/18/2023   Flu Shot  02/15/2024   Hepatitis C Screening  09/20/2027*   Mammogram  04/08/2024   Colon Cancer Screening  09/02/2024   Medicare Annual Wellness Visit  03/13/2025   DTaP/Tdap/Td vaccine (4 - Td or Tdap) 07/27/2027   Pneumococcal Vaccine for age over 62  Completed   DEXA scan (bone density measurement)  Completed   Zoster (Shingles) Vaccine  Completed   HPV Vaccine  Aged Out   Meningitis B Vaccine  Aged Out  *Topic was postponed. The date shown is not the original due date.    Advanced directives: (Copy Requested) Please bring a copy of your health care power of attorney and living will to the office to be added to your chart at your convenience. You can mail to [facility] [address] [place], [place] [address] or email to [email or web address] Advance Care Planning is important because it:  [x]  Makes sure you receive the medical  care that is consistent with your values, goals, and preferences  [x]  It provides guidance to your family and loved ones and reduces their decisional burden about whether or not they are making the right decisions based on your wishes.  Follow the link provided in your after visit summary or read over the paperwork we have mailed to you to help you started getting your Advance Directives in place. If you need assistance in completing these, please reach out to us  so that we can help you!

## 2024-06-18 ENCOUNTER — Ambulatory Visit: Admitting: Family Medicine

## 2024-06-18 ENCOUNTER — Encounter: Payer: Self-pay | Admitting: Family Medicine

## 2024-06-18 VITALS — BP 104/68 | HR 79 | Temp 98.1°F | Ht 66.0 in | Wt 161.5 lb

## 2024-06-18 DIAGNOSIS — Z860100 Personal history of colon polyps, unspecified: Secondary | ICD-10-CM

## 2024-06-18 DIAGNOSIS — E7800 Pure hypercholesterolemia, unspecified: Secondary | ICD-10-CM

## 2024-06-18 DIAGNOSIS — R7309 Other abnormal glucose: Secondary | ICD-10-CM

## 2024-06-18 DIAGNOSIS — Z1231 Encounter for screening mammogram for malignant neoplasm of breast: Secondary | ICD-10-CM

## 2024-06-18 DIAGNOSIS — Z23 Encounter for immunization: Secondary | ICD-10-CM | POA: Diagnosis not present

## 2024-06-18 DIAGNOSIS — K219 Gastro-esophageal reflux disease without esophagitis: Secondary | ICD-10-CM

## 2024-06-18 DIAGNOSIS — M8580 Other specified disorders of bone density and structure, unspecified site: Secondary | ICD-10-CM

## 2024-06-18 DIAGNOSIS — E2839 Other primary ovarian failure: Secondary | ICD-10-CM

## 2024-06-18 DIAGNOSIS — E559 Vitamin D deficiency, unspecified: Secondary | ICD-10-CM

## 2024-06-18 DIAGNOSIS — I10 Essential (primary) hypertension: Secondary | ICD-10-CM | POA: Diagnosis not present

## 2024-06-18 DIAGNOSIS — E538 Deficiency of other specified B group vitamins: Secondary | ICD-10-CM | POA: Diagnosis not present

## 2024-06-18 DIAGNOSIS — E0590 Thyrotoxicosis, unspecified without thyrotoxic crisis or storm: Secondary | ICD-10-CM

## 2024-06-18 DIAGNOSIS — Z79899 Other long term (current) drug therapy: Secondary | ICD-10-CM

## 2024-06-18 NOTE — Assessment & Plan Note (Signed)
 Disc goals for lipids and reasons to control them Rev last labs with pt Rev low sat fat diet in detail  Lab today  Declines medication Watching diet

## 2024-06-18 NOTE — Assessment & Plan Note (Signed)
 bp in fair control at this time  BP Readings from Last 1 Encounters:  06/18/24 104/68   No changes needed Most recent labs reviewed  Disc lifstyle change with low sodium diet and exercise  Hydrochlorothiazide  25 mg daily  Benicar  40 mg daily   Lab today

## 2024-06-18 NOTE — Assessment & Plan Note (Signed)
 B12 level today  Continues oral supplementation 1000 mcg daily

## 2024-06-18 NOTE — Assessment & Plan Note (Signed)
 Dexa ordered

## 2024-06-18 NOTE — Assessment & Plan Note (Signed)
 Much better control with protonix 40 mg daily Encouraged to avoid triggers

## 2024-06-18 NOTE — Progress Notes (Signed)
 Subjective:    Patient ID: Caroline Francis, female    DOB: 08-19-1950, 73 y.o.   MRN: [id number]  HPI  Pt presents for annual visit to review chronic medical problems   Wt Readings from Last 3 Encounters:  06/18/24 161 lb 8 oz (73.3 kg)  03/13/24 155 lb (70.3 kg)  09/03/23 155 lb 12.8 oz (70.7 kg)   26.07 kg/m  Vitals:   06/18/24 1427  BP: 104/68  Pulse: 79  Temp: 98.1 F (36.7 C)  SpO2: 99%    Immunization History  Administered Date(s) Administered   Fluad Quad(high Dose 65+) 05/09/2022   INFLUENZA, HIGH DOSE SEASONAL PF 05/06/2019, 03/21/2023, 06/18/2024   Influenza-Unspecified 08/03/2017, 06/16/2018, 04/27/2020   Moderna Covid-19 Fall Seasonal Vaccine 71yrs & older 03/21/2023   Moderna SARS-COV2 Booster Vaccination 10/07/2021, 10/07/2021   Moderna Sars-Covid-2 Vaccination 12/24/2020   PFIZER(Purple Top)SARS-COV-2 Vaccination 08/28/2019, 09/18/2019, 04/27/2020   PNEUMOCOCCAL CONJUGATE-20 10/07/2021, 05/21/2022   Pneumococcal Conjugate-13 09/19/2017   Pneumococcal Polysaccharide-23 03/03/2019   Respiratory Syncytial Virus Vaccine,Recomb Aduvanted(Arexvy) 05/21/2022   Td 12/22/2003   Tdap 04/15/2014, 07/26/2017   Zoster Recombinant(Shingrix) 09/21/2017, 12/28/2017   Zoster, Live 05/01/2012    Health Maintenance Due  Topic Date Due   Mammogram  04/08/2024   Flu shot - today   Mammogram 03/2023 - goes to [facility]  Self breast exam  Gyn health-hysterectomy   Colon cancer screening  Colonoscopy 08/2023  3 year recall   Bone health  Dexa  11/2020 -ready to schedule  Osteopenia  Falls-none  Fractures-none  Supplements -taking D and B12  Last vitamin D  Lab Results  Component Value Date   VD25OH 28.72 (L) 08/07/2023    Ex[facility]  [facility]  Silver sneakers  Circuit  Chair yoga    Mood    03/13/2024    3:13 PM 08/07/2023    4:08 PM 03/07/2023   12:55 PM 01/31/2022    8:51 AM 07/21/2020    8:58 AM  Depression screen PHQ 2/9   Decreased Interest 0 0 0 0 0  Down, Depressed, Hopeless 0 0 0 0 0  PHQ - 2 Score 0 0 0 0 0  Altered sleeping  0  0 0  Tired, decreased energy  0  0 0  Change in appetite  0  0 0  Feeling bad or failure about yourself   0  0 0  Trouble concentrating  0  0 0  Moving slowly or fidgety/restless  0  0 0  Suicidal thoughts  0  0 0  PHQ-9 Score  0   0  0   Difficult doing work/chores  Not difficult at all  Not difficult at all Not difficult at all     Data saved with a previous flowsheet row definition   HTN bp is stable today  No cp or palpitations or headaches or edema  No side effects to medicines  BP Readings from Last 3 Encounters:  06/18/24 104/68  09/03/23 114/72  08/17/23 128/88    Not light headed   Lab Results  Component Value Date   NA 139 08/17/2023   K 4.1 08/17/2023   CO2 29 08/17/2023   GLUCOSE 111 (H) 08/17/2023   BUN 13 08/17/2023   CREATININE 0.80 08/17/2023   CALCIUM 9.5 08/17/2023   GFR 73.62 08/17/2023   GFRNONAA >60 05/06/2021   Hydrochlorothiazide  25 mg daily  Benicar  40 mg daily   Eating healthy  Overall  History of hyperthyroidism  Lab Results  Component Value Date   TSH 1.73 08/07/2023   Glucose Lab Results  Component Value Date   HGBA1C 5.5 08/07/2023   HGBA1C 5.2 03/30/2022   HGBA1C 5.4 07/28/2020   Mindful of sugar   Hyperlipidemia Lab Results  Component Value Date   CHOL 224 (H) 11/12/2023   HDL 72.30 11/12/2023   LDLCALC 129 (H) 11/12/2023   LDLDIRECT 142.0 08/07/2023   TRIG 111.0 11/12/2023   CHOLHDL 3 11/12/2023   Declines medicine  Tries to avoid fried food and grease   B12 def Lab Results  Component Value Date   VITAMINB12 239 08/07/2023      Patient Active Problem List   Diagnosis Date Noted   Adnexal cyst 08/20/2023   History of colon polyps 08/07/2023   Corn of foot 10/27/2021   Lumbar radicular pain 10/27/2021   Status post reverse total shoulder replacement, right 05/11/2021   Rotator cuff  arthropathy of right shoulder 03/25/2021   Vitamin B12 deficiency 07/29/2020   Vitamin D  deficiency 07/29/2020   Current use of proton pump inhibitor 07/28/2020   Screening mammogram, encounter for 03/12/2019   Osteopenia 11/04/2017   Estrogen deficiency 09/19/2017   Elevated glucose level 09/19/2017   Osteoarthritis of hip 07/26/2015   External hemorrhoids 04/09/2012   Routine general medical examination at a health care facility 04/02/2012   SNORING, HX OF 05/04/2010   Hyperthyroidism 03/31/2010   Hyperlipidemia 12/25/2007   Essential hypertension 12/25/2007   GERD 12/25/2007   Diaphragmatic hernia 12/25/2007   Past Medical History:  Diagnosis Date   GERD (gastroesophageal reflux disease)    Hyperlipidemia    Hypertension    Low TSH level    Past Surgical History:  Procedure Laterality Date   ABDOMINAL HYSTERECTOMY     partial /bleeding   BALLOON DILATION  09/03/2023   Procedure: BALLOON DILATION;  Surgeon: Maryruth Ole DASEN, MD;  Location: [facility] END[facility]COPY;  Service: Endoscopy;;   COLONOSCOPY WITH PROPOFOL  N/A 08/18/2022   Procedure: COLONOSCOPY WITH PROPOFOL ;  Surgeon: Maryruth Ole DASEN, MD;  Location: [facility] ENDOSCOPY;  Service: Endoscopy;  Laterality: N/A;   COLONOSCOPY WITH PROPOFOL  N/A 09/03/2023   Procedure: COLONOSCOPY WITH PROPOFOL ;  Surgeon: Maryruth Ole DASEN, MD;  Location: [facility] ENDOSCOPY;  Service: Endoscopy;  Laterality: N/A;   ESOPHAGOGASTRODUODENOSCOPY N/A 08/18/2022   Procedure: ESOPHAGOGASTRODUODENOSCOPY (EGD);  Surgeon: Maryruth Ole DASEN, MD;  Location: [facility] ENDOSCOPY;  Service: Endoscopy;  Laterality: N/A;   ESOPHAGOGASTRODUODENOSCOPY (EGD) WITH PROPOFOL  N/A 09/03/2023   Procedure: ESOPHAGOGASTRODUODENOSCOPY (EGD) WITH PROPOFOL ;  Surgeon: Maryruth Ole DASEN, MD;  Location: [facility] ENDOSCOPY;  Service: Endoscopy;  Laterality: N/A;   NASAL SEPTUM SURGERY     REVERSE SHOULDER ARTHROPLASTY Right 05/10/2021   Procedure: REVERSE SHOULDER ARTHROPLASTY;  Surgeon:  Edie Norleen PARAS, MD;  Location: [facility] ORS;  Service: Orthopedics;  Laterality: Right;   Social History   Tobacco Use   Smoking status: Never   Smokeless tobacco: Never  Vaping Use   Vaping status: Never Used  Substance Use Topics   Alcohol use: Yes    Alcohol/week: 2.0 standard drinks of alcohol    Types: 2 Glasses of wine per week    Comment: occasional glass of wine   Drug use: Never   Family History  Problem Relation Age of Onset   Breast cancer Neg Hx    Allergies  Allergen Reactions   Lisinopril Cough   Current Outpatient Medications on File Prior to Visit  Medication Sig Dispense Refill  cholecalciferol (VITAMIN D3) 25 MCG (1000 UNIT) tablet Take 1,000 Units by mouth daily as needed.     hydrochlorothiazide  (HYDRODIURIL ) 25 MG tablet Take 1 tablet (25 mg total) by mouth daily. 90 tablet 3   olmesartan  (BENICAR ) 40 MG tablet Take 1 tablet (40 mg total) by mouth daily. 90 tablet 3   pantoprazole (PROTONIX) 40 MG tablet Take 40 mg by mouth daily.     vitamin B-12 (CYANOCOBALAMIN ) 1000 MCG tablet Take 1,000 mcg by mouth daily as needed.     No current facility-administered medications on file prior to visit.    Review of Systems  Constitutional:  Negative for activity change, appetite change, fatigue, fever and unexpected weight change.  HENT:  Negative for congestion, ear pain, rhinorrhea, sinus pressure and sore throat.   Eyes:  Negative for pain, redness and visual disturbance.  Respiratory:  Negative for cough, shortness of breath and wheezing.   Cardiovascular:  Negative for chest pain and palpitations.  Gastrointestinal:  Negative for abdominal pain, blood in stool, constipation and diarrhea.  Endocrine: Negative for polydipsia and polyuria.  Genitourinary:  Negative for dysuria, frequency and urgency.  Musculoskeletal:  Negative for arthralgias, back pain and myalgias.  Skin:  Negative for pallor and rash.  Allergic/Immunologic: Negative for environmental  allergies.  Neurological:  Negative for dizziness, syncope and headaches.  Hematological:  Negative for adenopathy. Does not bruise/bleed easily.  Psychiatric/Behavioral:  Negative for decreased concentration and dysphoric mood. The patient is not nervous/anxious.        Objective:   Physical Exam Constitutional:      General: She is not in acute distress.    Appearance: Normal appearance. She is well-developed and normal weight. She is not ill-appearing or diaphoretic.  HENT:     Head: Normocephalic and atraumatic.     Right Ear: Tympanic membrane, ear canal and external ear normal.     Left Ear: Tympanic membrane, ear canal and external ear normal.     Nose: Nose normal. No congestion.     Mouth/Throat:     Mouth: Mucous membranes are moist.     Pharynx: Oropharynx is clear. No posterior oropharyngeal erythema.  Eyes:     General: No scleral icterus.    Extraocular Movements: Extraocular movements intact.     Conjunctiva/sclera: Conjunctivae normal.     Pupils: Pupils are equal, round, and reactive to light.  Neck:     Thyroid : No thyromegaly.     Vascular: No carotid bruit or JVD.  Cardiovascular:     Rate and Rhythm: Normal rate and regular rhythm.     Pulses: Normal pulses.     Heart sounds: Normal heart sounds.     No gallop.  Pulmonary:     Effort: Pulmonary effort is normal. No respiratory distress.     Breath sounds: Normal breath sounds. No wheezing.     Comments: Good air exch Chest:     Chest wall: No tenderness.  Abdominal:     General: Bowel sounds are normal. There is no distension or abdominal bruit.     Palpations: Abdomen is soft. There is no mass.     Tenderness: There is no abdominal tenderness.     Hernia: No hernia is present.  Genitourinary:    Comments: Breast exam: No mass, nodules, thickening, tenderness, bulging, retraction, inflamation, nipple discharge or skin changes noted.  No axillary or clavicular LA.     Musculoskeletal:        General:  No tenderness. Normal  range of motion.     Cervical back: Normal range of motion and neck supple. No rigidity. No muscular tenderness.     Right lower leg: No edema.     Left lower leg: No edema.     Comments: No kyphosis   Lymphadenopathy:     Cervical: No cervical adenopathy.  Skin:    General: Skin is warm and dry.     Coloration: Skin is not pale.     Findings: No erythema or rash.     Comments: Solar lentigines diffusely Solar aging  Scattered sks  Neurological:     Mental Status: She is alert. Mental status is at baseline.     Cranial Nerves: No cranial nerve deficit.     Motor: No abnormal muscle tone.     Coordination: Coordination normal.     Gait: Gait normal.     Deep Tendon Reflexes: Reflexes are normal and symmetric. Reflexes normal.  Psychiatric:        Mood and Affect: Mood normal.        Cognition and Memory: Cognition and memory normal.           Assessment & Plan:   Problem List Items Addressed This Visit       Cardiovascular and Mediastinum   Essential hypertension - Primary   bp in fair control at this time  BP Readings from Last 1 Encounters:  06/18/24 104/68   No changes needed Most recent labs reviewed  Disc lifstyle change with low sodium diet and exercise  Hydrochlorothiazide  25 mg daily  Benicar  40 mg daily   Lab today       Relevant Orders   TSH   Lipid Panel   Comprehensive metabolic panel with GFR   CBC with Differential/Platelet     Digestive   GERD   Much better control with protonix 40 mg daily Encouraged to avoid triggers       Relevant Medications   pantoprazole (PROTONIX) 40 MG tablet     Endocrine   Hyperthyroidism   TSH today  Past RI treatment with endo  No clinical changes       Relevant Orders   TSH     Musculoskeletal and Integument   Osteopenia   Dexa ordered  No falls or fractures  Discussed fall prevention, supplements and exercise for bone density   D level today        Other    Vitamin D  deficiency   D level today Continues oral supplementation for bone and overall health      Relevant Orders   VITAMIN D  25 Hydroxy (Vit-D Deficiency, Fractures)   Vitamin B12 deficiency   B12 level today  Continues oral supplementation 1000 mcg daily      Relevant Orders   Vitamin B12   Screening mammogram, encounter for   Mammogram ordered Pt will call to schedule       Relevant Orders   MM 3D SCREENING MAMMOGRAM BILATERAL BREAST   Hyperlipidemia   Disc goals for lipids and reasons to control them Rev last labs with pt Rev low sat fat diet in detail  Lab today  Declines medication Watching diet       Relevant Orders   Lipid Panel   Comprehensive metabolic panel with GFR   History of colon polyps   Colonoscopy 08/2023 with 3 y recall      Estrogen deficiency   Dexa ordered       Relevant Orders   DG  Bone Density   Elevated glucose level   A1c ordered  disc imp of low glycemic diet and wt loss to prevent DM2       Relevant Orders   Hemoglobin A1c   Current use of proton pump inhibitor   Protonix 40 mg daily has worked much better than prevacid    B12 added to labs today      Other Visit Diagnoses       Need for influenza vaccination       Relevant Orders   Flu vaccine HIGH DOSE PF(Fluzone Trivalent) (Completed)

## 2024-06-18 NOTE — Assessment & Plan Note (Signed)
 Dexa ordered  No falls or fractures  Discussed fall prevention, supplements and exercise for bone density   D level today

## 2024-06-18 NOTE — Assessment & Plan Note (Signed)
 TSH today  Past RI treatment with endo  No clinical changes

## 2024-06-18 NOTE — Patient Instructions (Addendum)
 Keep taking good care of yourself  Keep up the great exercise   Avoid red meat/ fried foods/ egg yolks/ fatty breakfast meats/ butter, cheese and high fat dairy/ and shellfish    You have an order for:  [x]   3D Mammogram  [x]   Bone Density     Please call for appointment:   [x]   [facility]  [address] [place] [address]  [phone]  []   [facility])   [address]  [place], [place] [address]  [phone]  []   [facility]      [address] [place], [place]        [phone]         []   [facility]  [address] [place], [place]  [phone]  []  [facility]   [address]   [place], [place] [address]  [phone]  []  [facility]  [address] [place], [place] [address] [phone]    Make sure to wear two piece clothing  No lotions powders or deodorants the day of the appointment Make sure to bring picture ID and insurance card.  Bring list of medications you are currently taking including any supplements.   Schedule your screening mammogram through MyChart!   Select [place] imaging sites can now be scheduled through MyChart.  Log into your MyChart account.  Go to 'Visit' (or 'Appointments' if  on mobile App) --> Schedule an  Appointment  Under 'Select a Reason for Visit' choose the Mammogram  Screening option.  Complete the pre-visit questions  and select the time and place that  best fits your schedule

## 2024-06-18 NOTE — Assessment & Plan Note (Signed)
 Mammogram ordered Pt will call to schedule

## 2024-06-18 NOTE — Assessment & Plan Note (Signed)
 D level today Continues oral supplementation for bone and overall health

## 2024-06-18 NOTE — Assessment & Plan Note (Signed)
 Colonoscopy 08/2023 with 3 y recall

## 2024-06-18 NOTE — Assessment & Plan Note (Signed)
 A1c ordered   disc imp of low glycemic diet and wt loss to prevent DM2

## 2024-06-18 NOTE — Assessment & Plan Note (Signed)
 Protonix 40 mg daily has worked much better than prevacid    B12 added to labs today

## 2024-06-19 ENCOUNTER — Ambulatory Visit: Payer: Self-pay | Admitting: Family Medicine

## 2024-06-19 LAB — COMPREHENSIVE METABOLIC PANEL WITH GFR
ALT: 14 U/L (ref 0–35)
AST: 14 U/L (ref 0–37)
Albumin: 4.4 g/dL (ref 3.5–5.2)
Alkaline Phosphatase: 64 U/L (ref 39–117)
BUN: 19 mg/dL (ref 6–23)
CO2: 31 meq/L (ref 19–32)
Calcium: 9.6 mg/dL (ref 8.4–10.5)
Chloride: 97 meq/L (ref 96–112)
Creatinine, Ser: 0.68 mg/dL (ref 0.40–1.20)
GFR: 86.51 mL/min (ref >60.00)
Glucose, Bld: 94 mg/dL (ref 70–99)
Potassium: 4 meq/L (ref 3.5–5.1)
Sodium: 136 meq/L (ref 135–145)
Total Bilirubin: 0.5 mg/dL (ref 0.2–1.2)
Total Protein: 6.1 g/dL (ref 6.0–8.3)

## 2024-06-19 LAB — HEMOGLOBIN A1C: Hgb A1c MFr Bld: 4.8 % (ref 4.6–6.5)

## 2024-06-19 LAB — CBC WITH DIFFERENTIAL/PLATELET
Basophils Absolute: 0.1 K/uL (ref 0.0–0.1)
Basophils Relative: 0.8 % (ref 0.0–3.0)
Eosinophils Absolute: 0.1 K/uL (ref 0.0–0.7)
Eosinophils Relative: 1.1 % (ref 0.0–5.0)
HCT: 43.1 % (ref 36.0–46.0)
Hemoglobin: 15 g/dL (ref 12.0–15.0)
Lymphocytes Relative: 21.8 % (ref 12.0–46.0)
Lymphs Abs: 1.7 K/uL (ref 0.7–4.0)
MCHC: 34.8 g/dL (ref 30.0–36.0)
MCV: 96.9 fl (ref 78.0–100.0)
Monocytes Absolute: 0.6 K/uL (ref 0.1–1.0)
Monocytes Relative: 7 % (ref 3.0–12.0)
Neutro Abs: 5.5 K/uL (ref 1.4–7.7)
Neutrophils Relative %: 69.3 % (ref 43.0–77.0)
Platelets: 279 K/uL (ref 150.0–400.0)
RBC: 4.44 Mil/uL (ref 3.87–5.11)
RDW: 13 % (ref 11.5–15.5)
WBC: 7.9 K/uL (ref 4.0–10.5)

## 2024-06-19 LAB — LIPID PANEL
Cholesterol: 209 mg/dL — ABNORMAL HIGH (ref 0–200)
HDL: 62.2 mg/dL (ref >39.00)
LDL Cholesterol: 73 mg/dL (ref 0–99)
NonHDL: 146.91
Total CHOL/HDL Ratio: 3
Triglycerides: 370 mg/dL — ABNORMAL HIGH (ref 0.0–149.0)
VLDL: 74 mg/dL — ABNORMAL HIGH (ref 0.0–40.0)

## 2024-06-19 LAB — VITAMIN D 25 HYDROXY (VIT D DEFICIENCY, FRACTURES): VITD: 38.25 ng/mL (ref 30.00–100.00)

## 2024-06-19 LAB — VITAMIN B12: Vitamin B-12: 1194 pg/mL — ABNORMAL HIGH (ref 211–911)

## 2024-06-19 LAB — TSH: TSH: 0.94 u[IU]/mL (ref 0.35–5.50)

## 2024-07-30 ENCOUNTER — Ambulatory Visit
Admission: RE | Admit: 2024-07-30 | Discharge: 2024-07-30 | Disposition: A | Discharge status: Home / Self Care | Source: Ambulatory Visit | Attending: Family Medicine | Admitting: Family Medicine

## 2024-07-30 DIAGNOSIS — E2839 Other primary ovarian failure: Secondary | ICD-10-CM | POA: Diagnosis present

## 2024-07-30 DIAGNOSIS — Z1231 Encounter for screening mammogram for malignant neoplasm of breast: Secondary | ICD-10-CM | POA: Diagnosis present

## 2024-07-31 ENCOUNTER — Encounter: Payer: Self-pay | Admitting: *Deleted

## 2025-03-17 ENCOUNTER — Ambulatory Visit
